# Patient Record
Sex: Female | Born: 1950 | Race: White | Hispanic: No | Marital: Married | Smoking: Never smoker
Health system: Southern US, Community
[De-identification: ages and names within clinical notes are randomized; demographics above are authoritative.]

## PROBLEM LIST (undated history)

## (undated) DIAGNOSIS — M545 Low back pain, unspecified: Secondary | ICD-10-CM

## (undated) DIAGNOSIS — G459 Transient cerebral ischemic attack, unspecified: Secondary | ICD-10-CM

## (undated) DIAGNOSIS — IMO0001 Reserved for inherently not codable concepts without codable children: Secondary | ICD-10-CM

## (undated) DIAGNOSIS — G5600 Carpal tunnel syndrome, unspecified upper limb: Secondary | ICD-10-CM

## (undated) DIAGNOSIS — M1990 Unspecified osteoarthritis, unspecified site: Secondary | ICD-10-CM

## (undated) DIAGNOSIS — R112 Nausea with vomiting, unspecified: Secondary | ICD-10-CM

## (undated) DIAGNOSIS — F329 Major depressive disorder, single episode, unspecified: Secondary | ICD-10-CM

## (undated) DIAGNOSIS — I10 Essential (primary) hypertension: Secondary | ICD-10-CM

## (undated) DIAGNOSIS — G43909 Migraine, unspecified, not intractable, without status migrainosus: Secondary | ICD-10-CM

## (undated) DIAGNOSIS — N390 Urinary tract infection, site not specified: Secondary | ICD-10-CM

## (undated) DIAGNOSIS — E119 Type 2 diabetes mellitus without complications: Secondary | ICD-10-CM

## (undated) DIAGNOSIS — F32A Depression, unspecified: Secondary | ICD-10-CM

## (undated) DIAGNOSIS — M069 Rheumatoid arthritis, unspecified: Secondary | ICD-10-CM

## (undated) DIAGNOSIS — J329 Chronic sinusitis, unspecified: Secondary | ICD-10-CM

## (undated) DIAGNOSIS — F419 Anxiety disorder, unspecified: Secondary | ICD-10-CM

## (undated) DIAGNOSIS — M810 Age-related osteoporosis without current pathological fracture: Secondary | ICD-10-CM

## (undated) DIAGNOSIS — R748 Abnormal levels of other serum enzymes: Secondary | ICD-10-CM

## (undated) DIAGNOSIS — Z9889 Other specified postprocedural states: Secondary | ICD-10-CM

## (undated) DIAGNOSIS — M797 Fibromyalgia: Secondary | ICD-10-CM

## (undated) HISTORY — PX: ROTATOR CUFF REPAIR: SHX139

## (undated) HISTORY — PX: SHOULDER SURGERY: SHX246

## (undated) HISTORY — DX: Migraine, unspecified, not intractable, without status migrainosus: G43.909

## (undated) HISTORY — PX: TUBAL LIGATION: SHX77

## (undated) HISTORY — PX: BACK SURGERY: SHX140

## (undated) HISTORY — DX: Transient cerebral ischemic attack, unspecified: G45.9

## (undated) HISTORY — DX: Essential (primary) hypertension: I10

## (undated) HISTORY — PX: NASAL SINUS SURGERY: SHX719

## (undated) HISTORY — DX: Rheumatoid arthritis, unspecified: M06.9

## (undated) HISTORY — PX: TONSILLECTOMY: SUR1361

## (undated) HISTORY — DX: Depression, unspecified: F32.A

## (undated) HISTORY — DX: Abnormal levels of other serum enzymes: R74.8

## (undated) HISTORY — PX: PARTIAL HYSTERECTOMY: SHX80

## (undated) HISTORY — DX: Reserved for inherently not codable concepts without codable children: IMO0001

## (undated) HISTORY — DX: Low back pain: M54.5

## (undated) HISTORY — DX: Carpal tunnel syndrome, unspecified upper limb: G56.00

## (undated) HISTORY — DX: Fibromyalgia: M79.7

## (undated) HISTORY — DX: Age-related osteoporosis without current pathological fracture: M81.0

## (undated) HISTORY — DX: Anxiety disorder, unspecified: F41.9

## (undated) HISTORY — DX: Low back pain, unspecified: M54.50

## (undated) HISTORY — DX: Major depressive disorder, single episode, unspecified: F32.9

## (undated) HISTORY — DX: Urinary tract infection, site not specified: N39.0

## (undated) HISTORY — DX: Type 2 diabetes mellitus without complications: E11.9

---

## 2001-04-12 ENCOUNTER — Ambulatory Visit (HOSPITAL_COMMUNITY): Admission: RE | Admit: 2001-04-12 | Discharge: 2001-04-12 | Payer: Self-pay | Admitting: Internal Medicine

## 2001-04-17 ENCOUNTER — Encounter (INDEPENDENT_AMBULATORY_CARE_PROVIDER_SITE_OTHER): Payer: Self-pay | Admitting: Internal Medicine

## 2001-04-17 ENCOUNTER — Ambulatory Visit (HOSPITAL_COMMUNITY): Admission: RE | Admit: 2001-04-17 | Discharge: 2001-04-17 | Payer: Self-pay | Admitting: Internal Medicine

## 2001-05-09 ENCOUNTER — Encounter: Payer: Self-pay | Admitting: Internal Medicine

## 2001-05-09 ENCOUNTER — Encounter: Admission: RE | Admit: 2001-05-09 | Discharge: 2001-05-09 | Payer: Self-pay | Admitting: Internal Medicine

## 2001-07-17 HISTORY — PX: COLONOSCOPY: SHX174

## 2001-08-21 ENCOUNTER — Ambulatory Visit (HOSPITAL_COMMUNITY): Admission: RE | Admit: 2001-08-21 | Discharge: 2001-08-21 | Payer: Self-pay | Admitting: Pulmonary Disease

## 2001-08-27 ENCOUNTER — Ambulatory Visit (HOSPITAL_COMMUNITY): Admission: RE | Admit: 2001-08-27 | Discharge: 2001-08-27 | Payer: Self-pay | Admitting: Internal Medicine

## 2002-03-04 ENCOUNTER — Ambulatory Visit (HOSPITAL_COMMUNITY): Admission: RE | Admit: 2002-03-04 | Discharge: 2002-03-04 | Payer: Self-pay | Admitting: Pulmonary Disease

## 2002-11-19 ENCOUNTER — Ambulatory Visit (HOSPITAL_COMMUNITY): Admission: RE | Admit: 2002-11-19 | Discharge: 2002-11-19 | Payer: Self-pay | Admitting: Pulmonary Disease

## 2003-07-15 ENCOUNTER — Ambulatory Visit (HOSPITAL_COMMUNITY): Admission: RE | Admit: 2003-07-15 | Discharge: 2003-07-16 | Payer: Self-pay | Admitting: Orthopaedic Surgery

## 2004-03-30 ENCOUNTER — Ambulatory Visit: Admission: RE | Admit: 2004-03-30 | Discharge: 2004-03-30 | Payer: Self-pay | Admitting: Pulmonary Disease

## 2005-03-01 ENCOUNTER — Encounter: Admission: RE | Admit: 2005-03-01 | Discharge: 2005-03-01 | Payer: Self-pay | Admitting: Internal Medicine

## 2005-09-01 ENCOUNTER — Emergency Department (HOSPITAL_COMMUNITY): Admission: EM | Admit: 2005-09-01 | Discharge: 2005-09-01 | Payer: Self-pay | Admitting: Emergency Medicine

## 2005-11-08 ENCOUNTER — Ambulatory Visit: Payer: Self-pay | Admitting: Cardiology

## 2005-11-13 ENCOUNTER — Ambulatory Visit: Payer: Self-pay | Admitting: Cardiology

## 2005-11-29 ENCOUNTER — Ambulatory Visit: Payer: Self-pay

## 2005-11-29 ENCOUNTER — Encounter: Payer: Self-pay | Admitting: Cardiology

## 2005-12-28 ENCOUNTER — Ambulatory Visit: Payer: Self-pay | Admitting: Cardiology

## 2006-01-03 ENCOUNTER — Ambulatory Visit: Payer: Self-pay

## 2006-01-05 ENCOUNTER — Ambulatory Visit: Payer: Self-pay | Admitting: Cardiology

## 2006-01-16 ENCOUNTER — Ambulatory Visit: Payer: Self-pay | Admitting: Internal Medicine

## 2006-01-18 ENCOUNTER — Ambulatory Visit: Payer: Self-pay | Admitting: Internal Medicine

## 2006-01-22 ENCOUNTER — Ambulatory Visit (HOSPITAL_COMMUNITY): Admission: RE | Admit: 2006-01-22 | Discharge: 2006-01-22 | Payer: Self-pay | Admitting: Internal Medicine

## 2006-01-22 ENCOUNTER — Ambulatory Visit: Payer: Self-pay | Admitting: Internal Medicine

## 2006-01-29 ENCOUNTER — Ambulatory Visit: Payer: Self-pay

## 2006-03-01 ENCOUNTER — Ambulatory Visit: Payer: Self-pay | Admitting: Internal Medicine

## 2006-04-02 ENCOUNTER — Ambulatory Visit: Payer: Self-pay | Admitting: Cardiology

## 2006-04-24 ENCOUNTER — Ambulatory Visit: Payer: Self-pay | Admitting: Internal Medicine

## 2007-06-26 ENCOUNTER — Ambulatory Visit (HOSPITAL_COMMUNITY): Admission: RE | Admit: 2007-06-26 | Discharge: 2007-06-26 | Payer: Self-pay | Admitting: *Deleted

## 2007-07-03 ENCOUNTER — Ambulatory Visit (HOSPITAL_COMMUNITY): Admission: RE | Admit: 2007-07-03 | Discharge: 2007-07-03 | Payer: Self-pay | Admitting: *Deleted

## 2009-05-09 ENCOUNTER — Emergency Department (HOSPITAL_COMMUNITY): Admission: EM | Admit: 2009-05-09 | Discharge: 2009-05-09 | Payer: Self-pay | Admitting: Emergency Medicine

## 2009-05-12 ENCOUNTER — Ambulatory Visit (HOSPITAL_COMMUNITY): Admission: RE | Admit: 2009-05-12 | Discharge: 2009-05-12 | Payer: Self-pay | Admitting: Rheumatology

## 2009-05-28 ENCOUNTER — Ambulatory Visit (HOSPITAL_COMMUNITY): Admission: RE | Admit: 2009-05-28 | Discharge: 2009-05-28 | Payer: Self-pay | Admitting: Pulmonary Disease

## 2009-11-11 ENCOUNTER — Ambulatory Visit: Payer: Self-pay | Admitting: Cardiology

## 2009-11-11 ENCOUNTER — Ambulatory Visit (HOSPITAL_COMMUNITY): Admission: RE | Admit: 2009-11-11 | Discharge: 2009-11-11 | Payer: Self-pay | Admitting: Pulmonary Disease

## 2009-12-28 ENCOUNTER — Emergency Department (HOSPITAL_COMMUNITY): Admission: EM | Admit: 2009-12-28 | Discharge: 2009-12-28 | Payer: Self-pay | Admitting: Emergency Medicine

## 2010-04-18 ENCOUNTER — Ambulatory Visit (HOSPITAL_COMMUNITY): Admission: RE | Admit: 2010-04-18 | Discharge: 2010-04-18 | Payer: Self-pay | Admitting: Pulmonary Disease

## 2010-10-03 LAB — COMPREHENSIVE METABOLIC PANEL
Alkaline Phosphatase: 85 U/L (ref 39–117)
BUN: 33 mg/dL — ABNORMAL HIGH (ref 6–23)
CO2: 24 mEq/L (ref 19–32)
Calcium: 9.5 mg/dL (ref 8.4–10.5)
Creatinine, Ser: 2.59 mg/dL — ABNORMAL HIGH (ref 0.4–1.2)
Glucose, Bld: 107 mg/dL — ABNORMAL HIGH (ref 70–99)
Potassium: 3.6 mEq/L (ref 3.5–5.1)
Sodium: 133 mEq/L — ABNORMAL LOW (ref 135–145)
Total Bilirubin: 0.5 mg/dL (ref 0.3–1.2)

## 2010-10-03 LAB — DIFFERENTIAL
Eosinophils Relative: 2 % (ref 0–5)
Neutro Abs: 6.5 10*3/uL (ref 1.7–7.7)

## 2010-10-03 LAB — URINE CULTURE: Colony Count: NO GROWTH

## 2010-10-03 LAB — URINALYSIS, ROUTINE W REFLEX MICROSCOPIC
Glucose, UA: NEGATIVE mg/dL
Hgb urine dipstick: NEGATIVE
Nitrite: NEGATIVE
Specific Gravity, Urine: 1.03 (ref 1.005–1.030)

## 2010-10-03 LAB — CBC
Platelets: 374 10*3/uL (ref 150–400)
RBC: 3.71 MIL/uL — ABNORMAL LOW (ref 3.87–5.11)

## 2010-11-29 NOTE — Op Note (Signed)
NAMEMONTASIA, CHISENHALL NO.:  [id number]   MEDICAL RECORD NO.:  [id number]          PATIENT TYPE:  OIB   LOCATION:  2899                         FACILITY:  [facility]   PHYSICIAN:  Darlin Priestly, MD  DATE OF BIRTH:  02/05/1951   DATE OF PROCEDURE:  07/03/2007  DATE OF DISCHARGE:                               OPERATIVE REPORT   PROCEDURE:  Explant of a Medtronic Reveal Plus loop recorder.   ATTENDING PHYSICIAN:  Darlin Priestly, MD   COMPLICATIONS:  None.   INDICATIONS:  Ms. Shores is a 60 year old female patient of Dr. Kem Boroughs, Dr. Shaune Pollack with a history of syncope with negative tilt  table testing a subsequent implant of a loop recorder by Dr. Berton Mount  on January 22, 2006.  This was a Medtronic loop recorder serial number  [id number].  The patient was essentially lost to follow-up through the  [facility] and was seen by Dr. Kem Boroughs in her office on  June 17, 2007 asking to have the loop recorder explanted.  She hd had  no recurrent syncope episodes since her implant.  We did subsequent  interrogate her device in the office revealing no significant  arrhythmias.  She is now brought for loop explant.   DESCRIPTION OF OPERATION:  After informed consent, the patient was  brought to the cardiac cath lab where left chest was prepped and draped  in sterile fashion.  ECG monitoring was established.  1% lidocaine was  used to anesthetize over the previous implanted device.  Next,  approximately 1.5 cm incision then carried out over the device and  hemostasis obtained with electrocautery.  Blunt dissection carried this  down to the loop recorder capsule.  The capsule was then opened with  scalpel and device was then freed from the pocket.  The silk sutures  were then removed from the pocket.  Pocket was then copiously with 1%  kanamycin solution again and hemostasis was confirmed.  Subcutaneous  layer was then closed using 2-0 Vicryl.  The skin  was then closed in 4-0  Vicryl.  Steri-Strips were applied.  The patient transferred to recovery  room in stable condition.   CONCLUSIONS:  Successful explant of a Medtronic Reveal Plus loop  recorder serial number [id number] without complication.      Darlin Priestly, MD  Electronically Signed     RHM/MEDQ  D:  07/03/2007  T:  07/03/2007  Job:  [id number]   cc:   Dani Gobble, MD  Oneal Deputy. Juanetta Gosling, M.D.

## 2010-12-02 NOTE — Procedures (Signed)
[name], Sharon Spencer               ACCOUNT NO.:  [id number]   MEDICAL RECORD NO.:  [id number]          PATIENT TYPE:  OUT   LOCATION:  SLEEP LAB                     FACILITY:  [facility]   PHYSICIAN:  Marcelyn Bruins, M.D. [facility] DATE OF BIRTH:  01-Jul-1951   DATE OF STUDY:  03/30/2004                              NOCTURNAL POLYSOMNOGRAM   REFERRING PHYSICIAN:  Dr. Kari Baars.   INDICATION FOR THE STUDY:  Hypersomnia with sleep apnea.   SLEEP ARCHITECTURE:  The patient had a total sleep time of 348 minutes with  a sleep efficiency of 81%.  There was decreased REM and slow wave sleep.  Sleep onset latency was prolonged at 51 minutes, and REM latency was quite  prolonged at 249 minutes.   IMPRESSION:  1.  Moderate obstructive sleep apnea/hypopnea syndrome with moderate oxygen      desaturation.  The patient had a respiratory disturbance index of 31      events per hour and desaturation as low as 78%.  The events were not      positional but they were clearly more severe during REM.  The patient      did not meet split night criteria due to most of her events occurring in      the latter half of the night.  2.  Loud snoring noted throughout the study.  3.  No clinically significant cardiac arrhythmias.  4.  Large numbers of periodic leg movements with mild to moderate sleep      disruption.  Clinical correlation is suggested if she fails to improve      with treatment of her sleep apnea.      KC/MEDQ  D:  05/06/2004 16:18:11  T:  05/06/2004 17:36:35  Job:  [id number]
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
## 2010-12-02 NOTE — Op Note (Signed)
Sharon Spencer, KEHRES NO.:  [id number]   MEDICAL RECORD NO.:  [id number]          PATIENT TYPE:  OIB   LOCATION:  2899                         FACILITY:  [facility]   PHYSICIAN:  Duke Salvia, M.D.  DATE OF BIRTH:  08/08/1950   DATE OF PROCEDURE:  01/22/2006  DATE OF DISCHARGE:                                 OPERATIVE REPORT   PREOPERATIVE DIAGNOSIS:  Recurrent syncope.   POSTOPERATIVE DIAGNOSIS:  Recurrent syncope.   PROCEDURE:  Tilt table testing.   DESCRIPTION OF PROCEDURE:  Following obtaining informed consent, the patient  was brought to the electrophysiology laboratory and placed on the  fluoroscopic table in the supine position.  After routine prep and drape,  the patient was equilibrated in the supine position for about 10 minutes.  She was then tilted upright at 70 degrees.  From a blood pressure mean of  about 165 or so/80, with a pulse of 62, the blood pressure drifted slightly  over the ensuing 30 minutes down to a nadir of 134 with subsequent  equilibration at 155/91, so the final measurements were 155/91 with a pulse  of 67.  At this point, she was returned to the supine position.   Isoproterenol was started with 1 mg per minute accomplished and a 25%  increase in the incremental heart rate.  At that point, she was tilted up,  with a blood pressure of 151/85 with a pulse of 88.  The pulse stayed  stable.  There was a modest decrease in the blood pressure to a nadir of 127  but a mean of about 135.   IMPRESSION:  Negative tilt table test.   RECOMMENDATIONS:  Loop recorder implantation.           ______________________________  Duke Salvia, M.D.     SCK/MEDQ  D:  01/22/2006  T:  01/22/2006  Job:  [id number]   cc:   Olga Millers, M.D. [facility]  [address]  [address]  [place]  [place] [address]   Oneal Deputy. Juanetta Gosling, M.D.  Fax: [phone]
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
## 2010-12-02 NOTE — Assessment & Plan Note (Signed)
[facility]                           ELECTROPHYSIOLOGY OFFICE NOTE   Sharon Spencer, Sharon Spencer                      MRN:          [id number]  DATE:02/21/2006                            DOB:          1950/10/21    Sharon Spencer returns today for a wound check.  She is a very pleasant middle-  aged woman with a history of unexplained syncope.  She underwent insertion  of implantable loop recorder several weeks ago.  She developed tenderness,  soreness and drainage at the insertion site, which she states has improved  in the last week.  She denies fevers, chills or night sweats.   PHYSICAL EXAMINATION:  She is a pleasant middle-aged woman in no distress.  The blood pressure today was 130/78, the pulse 60 and regular.  Respirations  are 18.  Weight was 150 pounds.  Neck revealed no jugular venous distention.  Lungs are clear bilaterally with auscultation.  Cardiovascular exam revealed  a regular rate and rhythm with normal S1, S2.  Extremities demonstrated no  edema.  Her loop recorder insertion site had a 1 cm area of nonhealing of  the incision line with no obvious drainage at the present time, all  consistent with a stitch abscess.   IMPRESSION:  1.  Stitch abscess.  2.  Syncope.  3.  Status post loop recorder.   DISCUSSION:  I have recommended that Sharon Spencer take some antibiotics and  wash the area once or twice a day.  In addition, we have probed the area  today to see if there is any pockets of purulence, but there is not.  We  will plan to see her back in 1-2 weeks.                                   Doylene Canning. Ladona Ridgel, MD   GWT/MedQ  DD:  02/21/2006  DT:  02/21/2006  Job #:  [id number]

## 2010-12-02 NOTE — Op Note (Signed)
NAMEVERDELLE, VALTIERRA NO.:  [id number]   MEDICAL RECORD NO.:  [id number]          PATIENT TYPE:  OIB   LOCATION:  2899                         FACILITY:  [facility]   PHYSICIAN:  Duke Salvia, M.D.  DATE OF BIRTH:  06/03/1951   DATE OF PROCEDURE:  01/22/2006  DATE OF DISCHARGE:                                 OPERATIVE REPORT   PREOPERATIVE DIAGNOSIS:  Syncope.   POSTOPERATIVE DIAGNOSIS:  Syncope.   PROCEDURE:  Implantable loop insertion.   DESCRIPTION OF PROCEDURE:  After obtaining informed consent, the patient was  then prepared for loop recorder insertion.  After transcutaneous mapping,  the patient was prepped and draped in the standard fashion.  Lidocaine was  infiltrated about 5 cm caudal to the clavicle and about 2.5 cm lateral to  the sternum.   The incision was about 1.5 cm long and was carried down to the prepectoral  fascia.  A pocket was formed.  Two 0 silk sutures were placed at the  cephalad portion of the pocket which were used to then affix a Medtronic  loop recorder.  Loop recorder serial number was [id number].  The device was  inserted and secured to the prepectoral fascia.  The pocket was copiously  irrigated with antibiotic-containing saline solution.  Hemostasis having  been assured, the wound was then closed in three layers in normal fashion.  It was washed and dried, and a benzoin and Steri-Strip dressing was applied.  Needle counts, sponge counts, and instrument counts were correct at the end  of the procedure according to the staff.   The patient tolerated the procedure without apparent complications.           ______________________________  Duke Salvia, M.D.     SCK/MEDQ  D:  01/22/2006  T:  01/22/2006  Job:  [id number]   cc:   Olga Millers, M.D. [facility]  [address]  [address]  [place]  [place] [address]   Oneal Deputy. Juanetta Gosling, M.D.  Fax: [phone]

## 2010-12-02 NOTE — Assessment & Plan Note (Signed)
[facility]                           ELECTROPHYSIOLOGY OFFICE NOTE   [name], [name]                      MRN:          [id number]  DATE:01/29/2006                            DOB:          [birth year]    Sharon Spencer is seen in the device clinic today, January 29, 2006 for postop  followup of her recently implanted loop recorder.  Procedure done July 9 by  Dr. Graciela Husbands.   Upon exam, occlusive dressing has not been removed from the site.  She was  instructed to leave everything in place until seen in the office.  At this  point, her dressing and Steri-Strips were removed.  Site looks good without  redness or swelling.  Upon interrogation, there were no episodes stored in  the device.   The patient is aware of how to use the recorder and is aware to call us if  she does have a full syncopal episode.  Otherwise, she will be seen in  October by Dr. Graciela Husbands.                                   Cleatrice Burke, RN                                Duke Salvia, MD, [facility]   CF/MedQ  DD:  01/29/2006  DT:  01/29/2006  Job #:  [id number]

## 2010-12-02 NOTE — Op Note (Signed)
NAME:  Sharon Spencer, Sharon Spencer                         ACCOUNT NO.:  [id number]   MEDICAL RECORD NO.:  [id number]                   PATIENT TYPE:  OIB   LOCATION:  5003                                 FACILITY:  [facility]   PHYSICIAN:  Mark C. Ophelia Charter, M.D.                 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   OPERATION PERFORMED:  L4-5 decompression with bilateral foraminotomies.  Left microdiskectomy.   SURGEON:  Mark C. Ophelia Charter, M.D.   ASSISTANT:  Genene Churn. Denton Meek.   ANESTHESIA:  General orotracheal anesthesia plus Marcaine skin local.   DESCRIPTION OF PROCEDURE:  After induction of general anesthesia and  orotracheal intubation, the patient was placed on the Andrews frame with  careful padding and positioning.  The back was prepped with DuraPrep, the  area was squared with towels.  Betadine Vi-drape applied and the usual  laminectomy sheet.  Incision was made.  Cross-table lateral x-ray was used  to confirm the appropriate level with a needle placed at the 4-5 level.  Subperiosteal dissection of the lamina was performed.  Self-retaining  retractor was placed with the medium depth narrow blades.  The lamina was  removed about 50% on both the right and left side of the inferior one half  of the spinous process and the top one third of the L5 spinous process and  of course the lamina of both the right and left side.  There were some thick  chunks of ligament.  There was some epidural fat in the midline but no  epidural fat off the midline with some compression at the level of the  pedicle and just distal as the nerve roots entered the foramina bilaterally.  Thick chunks of ligament were removed and the spurs off the facet that were  narrowing.  Once this was performed and bone was removed on  both sides of  the pedicle, proximal areas were carefully palpated and there was room where  she did not require complete laminectomy.  Foramina were enlarged  bilaterally.  The vein was coagulated on the left side.  Disk was very soft  and bulging.  An incision was made.  Passes were made with just the minimal  amount of material.  It did not appear that the disk by MRI or by exam was  causing a significant amount of compression but it certainly was very soft  and there was some degenerative material that was centrally found that may  have been causing some intermittent bulging for the patient.  After  irrigation, the operative that had been used soon as the initial bone and  lamina were removal was started was then removed from the operative field.  Dura was intact.  There  was a minimal amount of bleeding and the fascia was  approximated with three or four simple interrupted Vicryl sutures, 2-0  Vicryl in the subcutaneous tissue and skin staple closure, 4 x 4s, ABD and  tape dressing.  The patient tolerated the procedure well and was transferred  to the recovery room in stable condition.                                               Mark C. Ophelia Charter, M.D.    MCY/MEDQ  D:  07/15/2003  T:  07/16/2003  Job:  [id number]

## 2010-12-02 NOTE — Letter (Signed)
March 01, 2006     Ramon Dredge L. Juanetta Gosling, MD  [address]  [place], [place] [address]   RE:  KARLYN, GLASCO  MRN:  [id number]  /  DOB:  [birth year]   Dear Renae Fickle,   Lonzo Candy is seen today again following loop recorder implantation.  Her  wound is healing.  There were some concerns about that initially.  Her  evaluation has included essentially normal cardiac function.  She had  negative tilt and underwent loop recorder implantation.   She is doing okay without recurrent syncope since May.   At this point, I will be reviewing the literature, but I think at three  months following syncope, driving is okay to be resumed.    Sincerely,      Duke Salvia, MD, [facility]   SCK/MedQ  DD:  03/01/2006  DT:  03/01/2006  Job #:  [id number]

## 2010-12-02 NOTE — Discharge Summary (Signed)
   NAME:  Sharon Spencer, VACCARELLA                         ACCOUNT NO.:  [id number]   MEDICAL RECORD NO.:  [id number]                   PATIENT TYPE:  OUT   LOCATION:  RAD                                  FACILITY:  [facility]   PHYSICIAN:  Vida Roller, M.D.                DATE OF BIRTH:  05-10-1951   DATE OF ADMISSION:  11/19/2002  DATE OF DISCHARGE:  11/19/2002                                 DISCHARGE SUMMARY   HISTORY OF PRESENT ILLNESS:  This is a 60 year old female with black out  spells.   DETAILS OF THE PROCEDURE:  The patient underwent ambulatory  electrocardiographic monitoring over a 23 hour 59 minute period from May 6-  7, 2004.  At that time she kept a detailed diary of her symptoms and  activities.  The technical quality of the study was adequate although there  was significant  artifact in several places.  The diary shows extensive  amount of activity with no documented symptoms.  Results, the predominant  rhythm is sinus throughout.  There are occasional PVCs.  There were no PACs.  There was no atrial arrhythmia.  There is no ventricular arrhythmia.  Resting heart rate is 57 and maximum heart rate is 119 all in sinus.  There  is no evidence of ventricular tachycardia, atrial tachycardia, atrial  fibrillation, ventricular fibrillation or significant  pause.  In essence a  benign Holter which is asymptomatic.                                                Vida Roller, M.D.    JH/MEDQ  D:  11/21/2002  T:  11/22/2002  Job:  [id number]

## 2010-12-02 NOTE — Assessment & Plan Note (Signed)
[facility]                              CARDIOLOGY OFFICE NOTE   ADWOA, AXE                      MRN:          [id number]  DATE:04/02/2006                            DOB:          May 04, 1951    Sharon Spencer returns for follow-up today.  She is a female whom I initially  saw in April secondary to hypertension.  At that time she related that she  had had multiple episodes of syncope in the past.  Workup included an  echocardiogram that showed normal LV function and mild left atrial  enlargement.  She had a nuclear study that apparently was unremarkable with  an ejection fraction of 77% and normal perfusion.  She also had an event  monitor.  She apparently has had 2 syncopal episodes, but did not activate  the monitor, per my notes of January 05, 2006.  It apparently malfunctioned 1  time, and the second time she had taken it off for bed.  She continued to  complain of syncopal episodes in June.  At that time we referred her to see  Dr. Graciela Husbands for possible loop implantation.  Dr. Graciela Husbands felt that the syncope  was most likely neurally mediated.  He therefore scheduled her to have a  tilt table.  This was negative and he subsequently put in a loop recorder.  The loop recorder was implanted on January 22, 2006.  I do have a note from Dr.  Graciela Husbands dated March 05, 2006 that states that [name] can resume driving  the first week in October.  Since that time she has had one dizzy spell, but  no further syncope by her report.  She denies any dyspnea or chest pain.  She is extremely upset in the office today, stating she is having  difficulties with her disability.  I explained to her that we could not  allow her to drive due to the risk of her having syncope while driving with  injury to herself or others.   MEDICATIONS AT PRESENT:  1. Atenolol 50 mg p.o. daily.  2. Cymbalta.  3. Folate.  4. Plaquenil 200 mg p.o. b.i.d.  5. Topamax 150 mg p.o.  nightly.  6. Verapamil SR 180 mg p.o. q. day.  7. Zanaflex.  8. Aspirin.  9. Altace 10 mg p.o. b.i.d.   PHYSICAL EXAM:  VITAL SIGNS:  Blood pressure 146/78, pulse 63.  She weighs  143 pounds.  CHEST:  Clear.  CARDIOVASCULAR:  Regular rate and rhythm.  EXTREMITIES:  No edema.   Her electrocardiogram today shows normal sinus rhythm at a rate of 63.  There are no ST changes noted.  Her QT is normal.  Her loop was interrogated  today and apparently showed no significant rhythm disturbance.   DIAGNOSES:  1. Recurrent syncope, cause unknown.  2. Hypertension.  3. History of mild hyperlipidemia.  4. Fibromyalgia.  5. Rheumatoid arthritis.  6. History of ulcerative colitis.  7. Migraine headaches.  8. Remote history of cerebrovascular accident by her report.   PLAN:  [name] has had no further syncopal  episodes.  Per Dr. Odessa Fleming  notes, she will be able to drive the first week in October.  She will  continue with the implantable loop and see him back as scheduled.  We have  scheduled her back in 6 months.  Note:  Her blood pressure appears to be  reasonably well controlled.                              Madolyn Frieze Jens Som, MD, [facility]    BSC/MedQ  DD:  04/02/2006  DT:  04/03/2006  Job #:  [id number]   cc:   Ramon Dredge L. Juanetta Gosling, M.D.

## 2011-12-07 ENCOUNTER — Other Ambulatory Visit (HOSPITAL_COMMUNITY): Payer: Self-pay | Admitting: Pulmonary Disease

## 2011-12-07 DIAGNOSIS — G459 Transient cerebral ischemic attack, unspecified: Secondary | ICD-10-CM

## 2011-12-12 ENCOUNTER — Other Ambulatory Visit (HOSPITAL_COMMUNITY): Payer: Self-pay

## 2011-12-14 ENCOUNTER — Ambulatory Visit (HOSPITAL_COMMUNITY)
Admission: RE | Admit: 2011-12-14 | Discharge: 2011-12-14 | Disposition: A | Discharge status: Home / Self Care | Payer: Medicare Other | Source: Ambulatory Visit | Attending: Pulmonary Disease | Admitting: Pulmonary Disease

## 2011-12-14 DIAGNOSIS — G459 Transient cerebral ischemic attack, unspecified: Secondary | ICD-10-CM

## 2011-12-14 DIAGNOSIS — I10 Essential (primary) hypertension: Secondary | ICD-10-CM | POA: Insufficient documentation

## 2011-12-14 DIAGNOSIS — I429 Cardiomyopathy, unspecified: Secondary | ICD-10-CM | POA: Insufficient documentation

## 2011-12-14 DIAGNOSIS — J329 Chronic sinusitis, unspecified: Secondary | ICD-10-CM | POA: Insufficient documentation

## 2012-03-28 LAB — CBC: MCV: 89 fL (ref 78–100)

## 2012-03-28 LAB — BASIC METABOLIC PANEL
BUN: 18 mg/dL (ref 4–21)
Calcium: 9.9 mg/dL
Creat: 0.9
Glucose: 133
Sodium: 136 mmol/L — AB (ref 137–147)

## 2012-04-09 ENCOUNTER — Other Ambulatory Visit (HOSPITAL_COMMUNITY): Payer: Self-pay | Admitting: Pulmonary Disease

## 2012-04-09 DIAGNOSIS — M797 Fibromyalgia: Secondary | ICD-10-CM

## 2012-04-09 DIAGNOSIS — M069 Rheumatoid arthritis, unspecified: Secondary | ICD-10-CM

## 2012-04-15 ENCOUNTER — Ambulatory Visit (HOSPITAL_COMMUNITY)
Admission: RE | Admit: 2012-04-15 | Discharge: 2012-04-15 | Disposition: A | Discharge status: Home / Self Care | Payer: Medicare Other | Source: Ambulatory Visit | Attending: Pulmonary Disease | Admitting: Pulmonary Disease

## 2012-04-15 DIAGNOSIS — M810 Age-related osteoporosis without current pathological fracture: Secondary | ICD-10-CM | POA: Insufficient documentation

## 2012-04-15 DIAGNOSIS — M797 Fibromyalgia: Secondary | ICD-10-CM

## 2012-04-15 DIAGNOSIS — M069 Rheumatoid arthritis, unspecified: Secondary | ICD-10-CM

## 2012-05-22 ENCOUNTER — Ambulatory Visit (INDEPENDENT_AMBULATORY_CARE_PROVIDER_SITE_OTHER): Payer: Medicare Other | Admitting: Gastroenterology

## 2012-05-22 ENCOUNTER — Encounter: Payer: Self-pay | Admitting: Gastroenterology

## 2012-05-22 VITALS — BP 135/85 | HR 71 | Temp 98.1°F | Ht 61.0 in | Wt 170.2 lb

## 2012-05-22 DIAGNOSIS — R109 Unspecified abdominal pain: Secondary | ICD-10-CM | POA: Insufficient documentation

## 2012-05-22 DIAGNOSIS — K219 Gastro-esophageal reflux disease without esophagitis: Secondary | ICD-10-CM

## 2012-05-22 DIAGNOSIS — R194 Change in bowel habit: Secondary | ICD-10-CM | POA: Insufficient documentation

## 2012-05-22 DIAGNOSIS — R1310 Dysphagia, unspecified: Secondary | ICD-10-CM | POA: Insufficient documentation

## 2012-05-22 DIAGNOSIS — R198 Other specified symptoms and signs involving the digestive system and abdomen: Secondary | ICD-10-CM

## 2012-05-22 NOTE — Progress Notes (Signed)
Primary Care Physician:  Fredirick Maudlin, MD  Primary Gastroenterologist:  Jonette Eva, MD   Chief Complaint  Patient presents with  . Dysphagia    HPI:  Sharon Spencer is a 61 y.o. female here for further evaluation of esophageal dysphagia at the request of Dr. Juanetta Gosling. She states she's been having refractory reflux. Lots of regurgitation at night. When it occurs, it lasts for several hours. Really bad over the past 6 months. She's been on protonix for about 4 months. Previously had tried to come off of it but couldn't tolerate it. Last month she had her Norvasc pill gets stuck. This caused severe odynophagia for several days. She notes if she eats too fast her food becomes lodged as well. It can happen with liquids. She tries to chop her meat up finely but sometimes if he becomes lodged and she has to induce vomiting for relief. Has had intermittent NSAID use but none recently. Typically causes her abdominal pain. She believes Plaquenil causes abdominal cramping.  BM alternate from diarrhea and constipation. Stool softner prn, couple of times per week. If skips couple of days, then leads to vomiting and diarrhea. No melena, brbpr. Occasional TUMS/Rolaids. Cannot tolerate vit D due to stomach spasms.   Yeast infections on Humira.  Just started Fosamax one month ago. Chronic prednisone 5 mg daily.  Current Outpatient Prescriptions  Medication Sig Dispense Refill  . adalimumab (HUMIRA) 40 MG/0.8ML injection Inject 40 mg into the skin every 14 (fourteen) days.      Marland Kitchen alendronate (FOSAMAX) 70 MG tablet Take 70 mg by mouth every 7 (seven) days.       . ALPRAZolam (XANAX) 0.5 MG tablet Take 0.5 mg by mouth at bedtime as needed.       Marland Kitchen amLODipine (NORVASC) 10 MG tablet Take 10 mg by mouth daily.       . clobetasol cream (TEMOVATE) 0.05 % Apply 1 application topically 2 (two) times daily.       . CYMBALTA 60 MG capsule Take 60 mg by mouth daily.       Marland Kitchen HYDROcodone-acetaminophen (LORTAB) 10-500  MG per tablet Take 1 tablet by mouth every 6 (six) hours as needed.      . hydroxychloroquine (PLAQUENIL) 200 MG tablet Take 200 mg by mouth daily.       . pantoprazole (PROTONIX) 40 MG tablet Take 40 mg by mouth daily.       . predniSONE (STERAPRED UNI-PAK) 5 MG TABS Take 5 mg by mouth daily.      Marland Kitchen tiZANidine (ZANAFLEX) 4 MG tablet         Allergies as of 05/22/2012 - Review Complete 05/22/2012  Allergen Reaction Noted  . Compazine (prochlorperazine edisylate)  05/22/2012  . Sulfa antibiotics  05/22/2012    Past Medical History  Diagnosis Date  . TIA (transient ischemic attack) 1991, 2006  . Carpal tunnel syndrome   . Lumbago   . Myalgia and myositis, unspecified   . Rheumatoid aortitis     sepcialist at [facility], Dr. Sigurd Sos  . Anxiety   . Depression   . Osteoporosis   . HTN (hypertension)   . Recurrent UTI     specialist at [facility]    Past Surgical History  Procedure Date  . Back surgery     lower  . Shoulder surgery     left  . Rotator cuff repair   . Tonsillectomy   . Nasal sinus surgery     x3  .  Tubal ligation   . Partial hysterectomy   . Colonoscopy     Dr. Karilyn Cota over ten years ago, ?colitis per patient? records have been requested 05/2012.     Family History  Problem Relation Age of Onset  . Colon cancer Neg Hx   . Diabetes Father   . Kidney failure Father   . Breast cancer Mother   . Stroke Mother   . Diabetes      siblings  . Lymphoma Sister   . Liver disease      history of elevated LFTs in sister too    History   Social History  . Marital Status: Married    Spouse Name: N/A    Number of Children: 1  . Years of Education: N/A   Occupational History  . disability    Social History Main Topics  . Smoking status: Never Smoker   . Smokeless tobacco: Not on file  . Alcohol Use: No  . Drug Use: No  . Sexually Active: Not on file   Other Topics Concern  . Not on file   Social History Narrative  . No narrative on file        ROS:  General: Negative for anorexia, weight loss, fever, chills, fatigue, weakness. Eyes: Negative for vision changes.  ENT: Negative for hoarseness, nasal congestion. CV: Negative for chest pain, angina, palpitations, dyspnea on exertion, peripheral edema.  Respiratory: Negative for dyspnea at rest, dyspnea on exertion, cough, sputum, wheezing.  GI: See history of present illness. GU:  Negative for dysuria, hematuria, urinary incontinence, urinary frequency, nocturnal urination.  MS: Chronic for joint pain, low back pain.  Derm: Negative for rash or itching.  Neuro: Negative for weakness, abnormal sensation, seizure, frequent headaches, memory loss, confusion.  Psych: Negative for anxiety, depression, suicidal ideation, hallucinations.  Endo: Negative for unusual weight change.  Heme: Negative for bruising or bleeding. Allergy: Negative for rash or hives.    Physical Examination:  BP 135/85  Pulse 71  Temp 98.1 F (36.7 C) (Temporal)  Ht 5\' 1"  (1.549 m)  Wt 170 lb 3.2 oz (77.202 kg)  BMI 32.16 kg/m2   General: Well-nourished, well-developed in no acute distress.  Head: Normocephalic, atraumatic.   Eyes: Conjunctiva pink, no icterus. Mouth: Oropharyngeal mucosa moist and pink , no lesions erythema or exudate. Neck: Supple without thyromegaly, masses, or lymphadenopathy.  Lungs: Clear to auscultation bilaterally.  Heart: Regular rate and rhythm, no murmurs rubs or gallops.  Abdomen: Bowel sounds are normal, nontender, nondistended, no hepatosplenomegaly or masses, no abdominal bruits or    hernia , no rebound or guarding.   Rectal: Deferred Extremities: No lower extremity edema. No clubbing or deformities.  Neuro: Alert and oriented x 4 , grossly normal neurologically.  Skin: Warm and dry, no rash or jaundice.   Psych: Alert and cooperative, normal mood and affect.

## 2012-05-22 NOTE — Patient Instructions (Addendum)
We have scheduled you for an upper endoscopy with Dr. Darrick Penna. See separate instructions. We will obtain copy of your lab work for review. Please be careful chewing food thoroughly and taking your time. Please consume plenty of liquid with your meals and medications to make sure your food/pills do not get stuck.

## 2012-05-24 ENCOUNTER — Encounter: Payer: Self-pay | Admitting: Gastroenterology

## 2012-05-24 NOTE — Assessment & Plan Note (Signed)
Dysphagia to solids/pills/liquids. Recent episode of odynophagia after having her Norvasc stick in her esophagus. Also with GERD refractory to pantoprazole. Recommend EGD/ED in near future with deep sedation due to polypharmacy. Patient requesting antiemetics at time of procedures due to prior difficulties with vomiting post-sedation.  I have discussed the risks, alternatives, benefits with regards to but not limited to the risk of reaction to medication, bleeding, infection, perforation and the patient is agreeable to proceed. Written consent to be obtained.

## 2012-05-24 NOTE — Progress Notes (Signed)
Received copy of last colonoscopy. It was done in 08/27/2001 by Dr. Karilyn Cota. Indication at that time was bloody diarrhea and abdominal cramps. 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 Biopsy suggestive of the same but not diagnostic.  Patient is due for a colonoscopy at this time but at OV I don't think she was interested.   Please let pt know that we reviewed her last colonoscopy and it has been ten years. She is due for colonoscopy now. If she is not interested, just document this. Thanks.

## 2012-05-24 NOTE — Assessment & Plan Note (Addendum)
Intermittent abdominal pain she relates to certain medications. Chronic alternating constipation/diarrhea. Retrieve copy of last colonoscopy report and last labs (Dr. Dwana Melena with [facility]). Patient not interested in colonoscopy at this time, stating she doesn't think she can tolerate both procedures right now.

## 2012-05-27 ENCOUNTER — Other Ambulatory Visit: Payer: Self-pay | Admitting: Gastroenterology

## 2012-05-27 ENCOUNTER — Encounter (HOSPITAL_COMMUNITY): Payer: Self-pay | Admitting: Pharmacy Technician

## 2012-05-27 ENCOUNTER — Telehealth: Payer: Self-pay | Admitting: Gastroenterology

## 2012-05-27 MED ORDER — PEG 3350-KCL-NA BICARB-NACL 420 G PO SOLR: 4000.0000 mL | ORAL | Status: DC

## 2012-05-27 NOTE — Progress Notes (Signed)
Faxed to PCP

## 2012-05-27 NOTE — Progress Notes (Signed)
LMOM to call.

## 2012-05-27 NOTE — Progress Notes (Signed)
Pt returned call and was informed. She said OK to add the colonoscopy to the EGD. Routing to [employer].

## 2012-05-27 NOTE — Progress Notes (Signed)
TCS has been added on an I have instructions ready for her to pick and she is aware

## 2012-05-27 NOTE — Telephone Encounter (Signed)
No PAC is required for Outpatient EGD per [facility]

## 2012-05-29 ENCOUNTER — Encounter (HOSPITAL_COMMUNITY): Payer: Self-pay

## 2012-05-29 ENCOUNTER — Encounter (HOSPITAL_COMMUNITY)
Admission: RE | Admit: 2012-05-29 | Discharge: 2012-05-29 | Disposition: A | Discharge status: Home / Self Care | Payer: Medicare Other | Source: Ambulatory Visit | Attending: Gastroenterology | Admitting: Gastroenterology

## 2012-05-29 ENCOUNTER — Inpatient Hospital Stay (HOSPITAL_COMMUNITY): Admission: RE | Admit: 2012-05-29 | Payer: Medicare Other | Source: Ambulatory Visit

## 2012-05-29 HISTORY — DX: Other specified postprocedural states: R11.2

## 2012-05-29 HISTORY — DX: Unspecified osteoarthritis, unspecified site: M19.90

## 2012-05-29 HISTORY — DX: Nausea with vomiting, unspecified: Z98.890

## 2012-05-29 LAB — BASIC METABOLIC PANEL
BUN: 16 mg/dL (ref 6–23)
CO2: 28 mEq/L (ref 19–32)
Calcium: 10.2 mg/dL (ref 8.4–10.5)
Chloride: 100 mEq/L (ref 96–112)
Creatinine, Ser: 0.86 mg/dL (ref 0.50–1.10)
Glucose, Bld: 102 mg/dL — ABNORMAL HIGH (ref 70–99)

## 2012-05-29 LAB — HEMOGLOBIN AND HEMATOCRIT, BLOOD: HCT: 46.1 % — ABNORMAL HIGH (ref 36.0–46.0)

## 2012-05-29 NOTE — Patient Instructions (Addendum)
Your procedure is scheduled on: 06/04/12   Report to [facility] at    [phone]   AM.  Call this number if you have problems the morning of surgery: [phone]   Remember:   Do not drink or eat food:After Midnight.    Take these medicines the morning of surgery with A SIP OF WATER: xanax, norvasc, tenormin, cymbalta, protonix  Do not wear jewelry, make-up or nail polish.  Do not wear lotions, powders, or perfumes. You may wear deodorant.  Do not shave 48 hours prior to surgery. Men may shave face and neck.  Do not bring valuables to the hospital.  Contacts, dentures or bridgework may not be worn into surgery.  Leave suitcase in the car. After surgery it may be brought to your room.  For patients admitted to the hospital, checkout time is 11:00 AM the day of discharge.   Patients discharged the day of surgery will not be allowed to drive home   Please read over the following fact sheets that you were given: Pain Booklet,  Surgical Site Infection Prevention, Anesthesia Post-op Instructions and Care and Recovery After Surgery  Esophagogastroduodenoscopy This is an endoscopic procedure (a procedure that uses a device like a flexible telescope) that allows your caregiver to view the upper stomach and small bowel. This test allows your caregiver to look at the esophagus. The esophagus carries food from your mouth to your stomach. They can also look at your duodenum. This is the first part of the small intestine that attaches to the stomach. This test is used to detect problems in the bowel such as ulcers and inflammation. PREPARATION FOR TEST Nothing to eat after midnight the day before the test. NORMAL FINDINGS Normal esophagus, stomach, and duodenum. Ranges for normal findings may vary among different laboratories and hospitals. You should always check with your doctor after having lab work or other tests done to discuss the meaning of your test results and whether your values are considered within  normal limits. MEANING OF TEST  Your caregiver will go over the test results with you and discuss the importance and meaning of your results, as well as treatment options and the need for additional tests if necessary. OBTAINING THE TEST RESULTS It is your responsibility to obtain your test results. Ask the lab or department performing the test when and how you will get your results. Document Released: 11/03/2004 Document Revised: 09/25/2011 Document Reviewed: 06/12/2008 [facility] Patient Information 2013 [place], [place]. Colonoscopy A colonoscopy is an exam to evaluate your entire colon. In this exam, your colon is cleansed. A long fiberoptic tube is inserted through your rectum and into your colon. The fiberoptic scope (endoscope) is a long bundle of enclosed and very flexible fibers. These fibers transmit light to the area examined and send images from that area to your caregiver. Discomfort is usually minimal. You may be given a drug to help you sleep (sedative) during or prior to the procedure. This exam helps to detect lumps (tumors), polyps, inflammation, and areas of bleeding. Your caregiver may also take a small piece of tissue (biopsy) that will be examined under a microscope. LET YOUR CAREGIVER KNOW ABOUT:   Allergies to food or medicine.  Medicines taken, including vitamins, herbs, eyedrops, over-the-counter medicines, and creams.  Use of steroids (by mouth or creams).  Previous problems with anesthetics or numbing medicines.  History of bleeding problems or blood clots.  Previous surgery.  Other health problems, including diabetes and kidney problems.  Possibility  of pregnancy, if this applies. BEFORE THE PROCEDURE   A clear liquid diet may be required for 2 days before the exam.  Ask your caregiver about changing or stopping your regular medications.  Liquid injections (enemas) or laxatives may be required.  A large amount of electrolyte solution may be given to you to  drink over a short period of time. This solution is used to clean out your colon.  You should be present 60 minutes prior to your procedure or as directed by your caregiver. AFTER THE PROCEDURE   If you received a sedative or pain relieving medication, you will need to arrange for someone to drive you home.  Occasionally, there is a little blood passed with the first bowel movement. Do not be concerned. FINDING OUT THE RESULTS OF YOUR TEST Not all test results are available during your visit. If your test results are not back during the visit, make an appointment with your caregiver to find out the results. Do not assume everything is normal if you have not heard from your caregiver or the medical facility. It is important for you to follow up on all of your test results. HOME CARE INSTRUCTIONS   It is not unusual to pass moderate amounts of gas and experience mild abdominal cramping following the procedure. This is due to air being used to inflate your colon during the exam. Walking or a warm pack on your belly (abdomen) may help.  You may resume all normal meals and activities after sedatives and medicines have worn off.  Only take over-the-counter or prescription medicines for pain, discomfort, or fever as directed by your caregiver. Do not use aspirin or blood thinners if a biopsy was taken. Consult your caregiver for medicine usage if biopsies were taken. SEEK IMMEDIATE MEDICAL CARE IF:   You have a fever.  You pass large blood clots or fill a toilet with blood following the procedure. This may also occur 10 to 14 days following the procedure. This is more likely if a biopsy was taken.  You develop abdominal pain that keeps getting worse and cannot be relieved with medicine. Document Released: 06/30/2000 Document Revised: 09/25/2011 Document Reviewed: 02/13/2008 [facility] Patient Information 2013 [place], [place].  PATIENT INSTRUCTIONS POST-ANESTHESIA  IMMEDIATELY FOLLOWING SURGERY:   Do not drive or operate machinery for the first twenty four hours after surgery.  Do not make any important decisions for twenty four hours after surgery or while taking narcotic pain medications or sedatives.  If you develop intractable nausea and vomiting or a severe headache please notify your doctor immediately.  FOLLOW-UP:  Please make an appointment with your surgeon as instructed. You do not need to follow up with anesthesia unless specifically instructed to do so.  WOUND CARE INSTRUCTIONS (if applicable):  Keep a dry clean dressing on the anesthesia/puncture wound site if there is drainage.  Once the wound has quit draining you may leave it open to air.  Generally you should leave the bandage intact for twenty four hours unless there is drainage.  If the epidural site drains for more than 36-48 hours please call the anesthesia department.  QUESTIONS?:  Please feel free to call your physician or the hospital operator if you have any questions, and they will be happy to assist you.

## 2012-06-04 ENCOUNTER — Encounter (HOSPITAL_COMMUNITY): Payer: Self-pay | Admitting: Anesthesiology

## 2012-06-04 ENCOUNTER — Other Ambulatory Visit: Payer: Self-pay | Admitting: Gastroenterology

## 2012-06-04 ENCOUNTER — Ambulatory Visit: Admit: 2012-06-04 | Payer: Self-pay | Admitting: Gastroenterology

## 2012-06-04 ENCOUNTER — Encounter (HOSPITAL_COMMUNITY)
Admission: RE | Disposition: A | Discharge status: Home / Self Care | Payer: Self-pay | Source: Ambulatory Visit | Attending: Gastroenterology

## 2012-06-04 ENCOUNTER — Ambulatory Visit (HOSPITAL_COMMUNITY)
Admission: RE | Admit: 2012-06-04 | Discharge: 2012-06-04 | Disposition: A | Discharge status: Home / Self Care | Payer: Medicare Other | Source: Ambulatory Visit | Attending: Gastroenterology | Admitting: Gastroenterology

## 2012-06-04 ENCOUNTER — Ambulatory Visit (HOSPITAL_COMMUNITY): Payer: Medicare Other | Admitting: Anesthesiology

## 2012-06-04 ENCOUNTER — Telehealth: Payer: Self-pay | Admitting: Gastroenterology

## 2012-06-04 ENCOUNTER — Encounter (HOSPITAL_COMMUNITY): Payer: Self-pay | Admitting: *Deleted

## 2012-06-04 DIAGNOSIS — K2970 Gastritis, unspecified, without bleeding: Secondary | ICD-10-CM | POA: Insufficient documentation

## 2012-06-04 DIAGNOSIS — D126 Benign neoplasm of colon, unspecified: Secondary | ICD-10-CM

## 2012-06-04 DIAGNOSIS — R1013 Epigastric pain: Secondary | ICD-10-CM | POA: Insufficient documentation

## 2012-06-04 DIAGNOSIS — R1310 Dysphagia, unspecified: Secondary | ICD-10-CM | POA: Insufficient documentation

## 2012-06-04 DIAGNOSIS — Z1211 Encounter for screening for malignant neoplasm of colon: Secondary | ICD-10-CM

## 2012-06-04 DIAGNOSIS — K2990 Gastroduodenitis, unspecified, without bleeding: Secondary | ICD-10-CM

## 2012-06-04 DIAGNOSIS — K222 Esophageal obstruction: Secondary | ICD-10-CM | POA: Insufficient documentation

## 2012-06-04 DIAGNOSIS — K3189 Other diseases of stomach and duodenum: Secondary | ICD-10-CM | POA: Insufficient documentation

## 2012-06-04 DIAGNOSIS — I10 Essential (primary) hypertension: Secondary | ICD-10-CM | POA: Insufficient documentation

## 2012-06-04 HISTORY — PX: ESOPHAGOGASTRODUODENOSCOPY (EGD) WITH PROPOFOL: SHX5813

## 2012-06-04 HISTORY — PX: POLYPECTOMY: SHX5525

## 2012-06-04 HISTORY — PX: COLONOSCOPY WITH PROPOFOL: SHX5780

## 2012-06-04 HISTORY — PX: SAVORY DILATION: SHX5439

## 2012-06-04 SURGERY — ESOPHAGOGASTRODUODENOSCOPY (EGD) WITH PROPOFOL
Anesthesia: Monitor Anesthesia Care

## 2012-06-04 SURGERY — COLONOSCOPY WITH PROPOFOL
Anesthesia: Monitor Anesthesia Care | Site: Esophagus

## 2012-06-04 MED ORDER — STERILE WATER FOR IRRIGATION IR SOLN
Status: DC | PRN
  Administered 2012-06-04: 08:00:00

## 2012-06-04 MED ORDER — BUTAMBEN-TETRACAINE-BENZOCAINE 2-2-14 % EX AERO
1.0000 | INHALATION_SPRAY | Freq: Once | CUTANEOUS | Status: AC
  Administered 2012-06-04: 1 via TOPICAL
  Filled 2012-06-04: qty 56

## 2012-06-04 MED ORDER — FENTANYL CITRATE 0.05 MG/ML IJ SOLN
INTRAMUSCULAR | Status: DC | PRN
  Administered 2012-06-04: 25 ug via INTRAVENOUS
  Administered 2012-06-04: 50 ug via INTRAVENOUS
  Administered 2012-06-04: 25 ug via INTRAVENOUS

## 2012-06-04 MED ORDER — LACTATED RINGERS IV SOLN
INTRAVENOUS | Status: DC
  Administered 2012-06-04: 1000 mL via INTRAVENOUS

## 2012-06-04 MED ORDER — PROPOFOL INFUSION 10 MG/ML OPTIME
INTRAVENOUS | Status: DC | PRN
  Administered 2012-06-04: 150 ug/kg/min via INTRAVENOUS
  Administered 2012-06-04: 09:00:00 via INTRAVENOUS

## 2012-06-04 MED ORDER — ONDANSETRON HCL 4 MG/2ML IJ SOLN: 4.0000 mg | Freq: Once | INTRAMUSCULAR | Status: DC | PRN

## 2012-06-04 MED ORDER — MIDAZOLAM HCL 2 MG/2ML IJ SOLN
INTRAMUSCULAR | Status: AC
  Filled 2012-06-04: qty 2

## 2012-06-04 MED ORDER — FENTANYL CITRATE 0.05 MG/ML IJ SOLN: 25.0000 ug | INTRAMUSCULAR | Status: DC | PRN

## 2012-06-04 MED ORDER — FENTANYL CITRATE 0.05 MG/ML IJ SOLN
INTRAMUSCULAR | Status: AC
  Filled 2012-06-04: qty 2

## 2012-06-04 MED ORDER — PROPOFOL 10 MG/ML IV EMUL
INTRAVENOUS | Status: AC
  Filled 2012-06-04: qty 20

## 2012-06-04 MED ORDER — LIDOCAINE HCL (PF) 1 % IJ SOLN
INTRAMUSCULAR | Status: AC
  Filled 2012-06-04: qty 5

## 2012-06-04 MED ORDER — MIDAZOLAM HCL 2 MG/2ML IJ SOLN
1.0000 mg | INTRAMUSCULAR | Status: DC | PRN
  Administered 2012-06-04: 2 mg via INTRAVENOUS

## 2012-06-04 MED ORDER — GLYCOPYRROLATE 0.2 MG/ML IJ SOLN
INTRAMUSCULAR | Status: AC
  Filled 2012-06-04: qty 1

## 2012-06-04 MED ORDER — GLYCOPYRROLATE 0.2 MG/ML IJ SOLN
0.2000 mg | Freq: Once | INTRAMUSCULAR | Status: AC
  Administered 2012-06-04: 0.2 mg via INTRAVENOUS

## 2012-06-04 MED ORDER — ONDANSETRON HCL 4 MG/2ML IJ SOLN
4.0000 mg | Freq: Once | INTRAMUSCULAR | Status: AC
  Administered 2012-06-04: 4 mg via INTRAVENOUS

## 2012-06-04 MED ORDER — MINERAL OIL LIGHT 100 % EX OIL
TOPICAL_OIL | CUTANEOUS | Status: AC | PRN
  Administered 2012-06-04: 1 via TOPICAL

## 2012-06-04 MED ORDER — ONDANSETRON HCL 4 MG/2ML IJ SOLN
INTRAMUSCULAR | Status: AC
  Filled 2012-06-04: qty 2

## 2012-06-04 MED ORDER — WATER FOR IRRIGATION, STERILE IR SOLN
Status: DC | PRN
  Administered 2012-06-04: 1000 mL

## 2012-06-04 SURGICAL SUPPLY — 21 items
BLOCK BITE 60FR ADLT L/F BLUE (MISCELLANEOUS) ×3 IMPLANT
ELECT REM PT RETURN 9FT ADLT (ELECTROSURGICAL)
ELECTRODE REM PT RTRN 9FT ADLT (ELECTROSURGICAL) IMPLANT
FLOOR PAD 36X40 (MISCELLANEOUS) ×3
FORCEPS BIOP RAD 4 LRG CAP 4 (CUTTING FORCEPS) ×2 IMPLANT
INJECTOR/SNARE I SNARE (MISCELLANEOUS) IMPLANT
LUBRICANT JELLY 4.5OZ STERILE (MISCELLANEOUS) ×1 IMPLANT
MANIFOLD NEPTUNE II (INSTRUMENTS) ×3 IMPLANT
NDL SCLEROTHERAPY 25GX240 (NEEDLE) IMPLANT
NEEDLE SCLEROTHERAPY 25GX240 (NEEDLE) IMPLANT
PAD FLOOR 36X40 (MISCELLANEOUS) ×2 IMPLANT
PROBE APC STR FIRE (PROBE) IMPLANT
PROBE INJECTION GOLD (MISCELLANEOUS)
PROBE INJECTION GOLD 7FR (MISCELLANEOUS) IMPLANT
SNARE ROTATE MED OVAL 20MM (MISCELLANEOUS) IMPLANT
SNARE SHORT THROW 13M SML OVAL (MISCELLANEOUS) ×2 IMPLANT
SYR 50ML LL SCALE MARK (SYRINGE) IMPLANT
TRAP SPECIMEN MUCOUS 40CC (MISCELLANEOUS) IMPLANT
TUBING ENDO SMARTCAP PENTAX (MISCELLANEOUS) ×3 IMPLANT
TUBING IRRIGATION ENDOGATOR (MISCELLANEOUS) ×3 IMPLANT
WATER STERILE IRR 1000ML POUR (IV SOLUTION) ×2 IMPLANT

## 2012-06-04 NOTE — H&P (Signed)
Primary Care Physician:  HAWKINS,EDWARD L, MD Primary Gastroenterologist:  Dr. Fields  Pre-Procedure History & Physical: HPI:  Sharon Spencer is a 61 y.o. female here for DYSPHAGIA/screening.  Past Medical History  Diagnosis Date  . TIA (transient ischemic attack) 1991, 2006  . Lumbago   . Rheumatoid aortitis     sepcialist at [facility], Dr. Kate Mitchell  . Anxiety   . Depression   . Osteoporosis   . HTN (hypertension)   . Recurrent UTI     specialist at [facility]  . Migraine   . Carpal tunnel syndrome   . Myalgia and myositis, unspecified   . Fibromyalgia   . Arthritis     rheumatoid  . PONV (postoperative nausea and vomiting)     Past Surgical History  Procedure Date  . Back surgery     lower  . Shoulder surgery     left  . Rotator cuff repair   . Tonsillectomy   . Nasal sinus surgery     x3  . Tubal ligation   . Partial hysterectomy   . Colonoscopy     Dr. Rehman--> over ten years ago, ?colitis per patient? records have been requested 05/2012.     Prior to Admission medications   Medication Sig Start Date End Date Taking? Authorizing Provider  adalimumab (HUMIRA) 40 MG/0.8ML injection Inject 40 mg into the skin every 14 (fourteen) days.   Yes Historical Provider, MD  alendronate (FOSAMAX) 70 MG tablet Take 70 mg by mouth every 7 (seven) days.  04/22/12  Yes Historical Provider, MD  ALPRAZolam (XANAX) 0.5 MG tablet Take 0.5 mg by mouth at bedtime as needed. Hypertension. 05/01/12  Yes Historical Provider, MD  amLODipine (NORVASC) 10 MG tablet Take 10 mg by mouth daily.  05/07/12  Yes Historical Provider, MD  atenolol (TENORMIN) 50 MG tablet Take 25 mg by mouth daily.   Yes Historical Provider, MD  clobetasol cream (TEMOVATE) 0.05 % Apply 1 application topically 2 (two) times daily.  04/24/12  Yes Historical Provider, MD  CYMBALTA 60 MG capsule Take 60 mg by mouth daily.  03/08/12  Yes Historical Provider, MD  HYDROcodone-acetaminophen (LORTAB) 10-500 MG per tablet Take 1  tablet by mouth every 6 (six) hours as needed. Pain.   Yes Historical Provider, MD  hydroxychloroquine (PLAQUENIL) 200 MG tablet Take 400 mg by mouth daily.  05/07/12  Yes Historical Provider, MD  pantoprazole (PROTONIX) 40 MG tablet Take 40 mg by mouth daily.  05/11/12  Yes Historical Provider, MD  predniSONE (STERAPRED UNI-PAK) 5 MG TABS Take 5 mg by mouth daily.   Yes Historical Provider, MD  tiZANidine (ZANAFLEX) 4 MG tablet Take 4-8 mg by mouth at bedtime.  05/07/12  Yes Historical Provider, MD    Allergies as of 05/27/2012 - Review Complete 05/27/2012  Allergen Reaction Noted  . Compazine (prochlorperazine edisylate) Other (See Comments) 05/22/2012  . Sulfa antibiotics Nausea And Vomiting 05/22/2012    Family History  Problem Relation Age of Onset  . Colon cancer Neg Hx   . Diabetes Father   . Kidney failure Father   . Breast cancer Mother   . Stroke Mother   . Diabetes      siblings  . Lymphoma Sister   . Liver disease      history of elevated LFTs in sister too    History   Social History  . Marital Status: Married    Spouse Name: N/A    Number of Children: 1  .   Years of Education: N/A   Occupational History  . disability    Social History Main Topics  . Smoking status: Never Smoker   . Smokeless tobacco: Not on file  . Alcohol Use: No  . Drug Use: No  . Sexually Active: Not on file   Other Topics Concern  . Not on file   Social History Narrative  . No narrative on file    Review of Systems: See HPI, otherwise negative ROS   Physical Exam: BP 122/88  Pulse 64  Temp 97.8 F (36.6 C) (Oral)  Resp 11  SpO2 93% General:   Alert,  pleasant and cooperative in NAD Head:  Normocephalic and atraumatic. Neck:  Supple; Lungs:  Clear throughout to auscultation.    Heart:  Regular rate and rhythm. Abdomen:  Soft, nontender and nondistended. Normal bowel sounds, without guarding, and without rebound.   Neurologic:  Alert and  oriented x4;  grossly normal  neurologically.  Impression/Plan:     DYSPHAGIA/screening  PLAN:  EGD/DIL/tcs TODAY  

## 2012-06-04 NOTE — Op Note (Signed)
[facility] [address] [place], [address]   ENDOSCOPY PROCEDURE REPORT  PATIENT: Sharon Spencer, Sharon Spencer.  MR#: [id number] BIRTHDATE: [birth year] , 61  yrs. old GENDER: Female  ENDOSCOPIST: Jonette Eva, MD REFFERED LK:[id number] Juanetta Gosling, M.D.  PROCEDURE DATE:  06/04/2012 PROCEDURE:   EGD with biopsy and EGD with dilatation over guidewire   INDICATIONS:1.  dysphagia.   2.  dyspepsia. MEDICATIONS: MAC sedation, administered by CRNA TOPICAL ANESTHETIC: Cetacaine Spray  DESCRIPTION OF PROCEDURE:   After the risks benefits and alternatives of the procedure were thoroughly explained, informed consent was obtained.  The     endoscope was introduced through the mouth and advanced to the second portion of the duodenum.  The instrument was slowly withdrawn as the mucosa was carefully examined.  Prior to withdrawal of the scope, the guidwire was placed.  The esophagus was dilated successfully.  The patient was recovered in endoscopy and discharged home in satisfactory condition.      SIGMOID ESOPHAGUS(DISTAL).  ESOPHAGUS: A moderately severe Schatzki ring was found at the gastroesophageal junction.  STOMACH: Moderate non-erosive gastritis (inflammation) was found in the gastric antrum.  Multiple biopsies were performed.  DUODENUM: The duodenal mucosa showed no abnormalities in the bulb and second portion of the duodenum.   Dilation was then performed at the gastroesphageal junction  Dilator: Savary over guidewire Size(s): 12-14MM Resistance: moderate  COMPLICATIONS: There were no complications.   ENDOSCOPIC IMPRESSION: 1.   SIGMOID ESOPHAGUS(DISTAL) 2.   Schatzki ring was found at the gastroesophageal junction 3.   Non-erosive gastritis (inflammation) was found in the gastric antrum; multiple biopsies 4.   The duodenal mucosa showed no abnormalities in the bulb and second portion of the duodenum  RECOMMENDATIONS: CONTINUE PROTONIX 30 MINUTES PRIOR TO YOUR  FIRST MEAL.  FOLLOW A SOFT MECHANICAL/LOW FAT DIET.  AVOID ITEMS THAT CAUSE BLOATING.  MEATS SHOULD BE CHOPPED OR GROUND ONLY.  BIOPSY WILL BE BACK IN 7 DAYS.  ANOTHER DILATION IN 2 WEEKS.  Follow up in 4 mos.      _______________________________ Gwendlyn Deutscher, MD 06/04/2012 3:55 PM      PATIENT NAME:  Sharon Spencer. MR#: [id number]

## 2012-06-04 NOTE — Telephone Encounter (Signed)
Short Stay nurse called for [place] to set up another EGD with Dilation in 2 weeks and I will NIC OV to FU in 4 months

## 2012-06-04 NOTE — Anesthesia Postprocedure Evaluation (Signed)
Anesthesia Post Note  Patient: Sharon Spencer  Procedure(s) Performed: Procedure(s) (LRB): COLONOSCOPY WITH PROPOFOL (N/A) ESOPHAGOGASTRODUODENOSCOPY (EGD) WITH PROPOFOL (N/A) SAVORY DILATION (N/A) POLYPECTOMY (N/A)  Anesthesia type: MAC  Patient location: PACU  Post pain: Pain level controlled  Post assessment: Post-op Vital signs reviewed, Patient's Cardiovascular Status Stable, Respiratory Function Stable, Patent Airway, No signs of Nausea or vomiting and Pain level controlled  Last Vitals:  Filed Vitals:   06/04/12 0925  BP: 118/74  Pulse: 76  Temp: 36.5 C  Resp: 12    Post vital signs: Reviewed and stable  Level of consciousness: awake and alert   Complications: No apparent anesthesia complications

## 2012-06-04 NOTE — Anesthesia Procedure Notes (Signed)
Procedure Name: MAC Date/Time: 06/04/2012 8:26 AM Performed by: Franco Nones Pre-anesthesia Checklist: Patient identified, Emergency Drugs available, Suction available, Timeout performed and Patient being monitored Patient Re-evaluated:Patient Re-evaluated prior to inductionOxygen Delivery Method: Non-rebreather mask

## 2012-06-04 NOTE — Op Note (Signed)
[facility] [address] [place], [address]   COLONOSCOPY PROCEDURE REPORT  PATIENT: Sharon Spencer, Sharon Spencer.  MR#: [id number] BIRTHDATE: 11/26/1950 , 61  yrs. old GENDER: Female ENDOSCOPIST: Jonette Eva, MD REFERRED XB:[id number] Juanetta Gosling, M.D. PROCEDURE DATE:  06/04/2012 PROCEDURE:   Colonoscopy with cold biopsy polypectomy INDICATIONS:Average risk patient for colon cancer. MEDICATIONS: MAC sedation, administered by CRNA  DESCRIPTION OF PROCEDURE:    Physical exam was performed.  Informed consent was obtained from the patient after explaining the benefits, risks, and alternatives to procedure.  The patient was connected to monitor and placed in left lateral position. Continuous oxygen was provided by nasal cannula and IV medicine administered through an indwelling cannula.  After administration of sedation and rectal exam, the patients rectum was intubated and the     colonoscope was advanced under direct visualization to the cecum.  The scope was removed slowly by carefully examining the color, texture, anatomy, and integrity mucosa on the way out.  The patient was recovered in endoscopy and discharged home in satisfactory condition.       COLON FINDINGS: A sessile polyp measuring 3 mm in size was found in the ascending colon.  A polypectomy was performed with cold forceps.  PREP QUALITY: good. CECAL W/D TIME: 14 minutes  COMPLICATIONS: None  ENDOSCOPIC IMPRESSION: Sessile polyp measuring 3 mm in size was found in the ascending colon; polypectomy was performed with cold forceps   RECOMMENDATIONS: Await biopsy HIGH FIBER DIET TCS IN 10 YEARS       _______________________________ eSignedJonette Eva, MD 06/04/2012 3:45 PM

## 2012-06-04 NOTE — Anesthesia Preprocedure Evaluation (Signed)
Anesthesia Evaluation  Patient identified by MRN, date of birth, ID band Patient awake    Reviewed: Allergy & Precautions, H&P , NPO status , Patient's Chart, lab work & pertinent test results  History of Anesthesia Complications (+) PONV  Airway Mallampati: III TM Distance: >3 FB Neck ROM: Limited  Mouth opening: Limited Mouth Opening  Dental  (+) Teeth Intact   Pulmonary neg pulmonary ROS,  breath sounds clear to auscultation        Cardiovascular hypertension, Pt. on medications Rhythm:Regular Rate:Normal     Neuro/Psych  Headaches, PSYCHIATRIC DISORDERS Anxiety Depression TIA   GI/Hepatic GERD-  Medicated,  Endo/Other    Renal/GU      Musculoskeletal  (+) Arthritis -, Rheumatoid disorders,  Fibromyalgia -  Abdominal   Peds  Hematology   Anesthesia Other Findings   Reproductive/Obstetrics                           Anesthesia Physical Anesthesia Plan  ASA: III  Anesthesia Plan: MAC   Post-op Pain Management:    Induction: Intravenous  Airway Management Planned: Simple Face Mask  Additional Equipment:   Intra-op Plan:   Post-operative Plan:   Informed Consent: I have reviewed the patients History and Physical, chart, labs and discussed the procedure including the risks, benefits and alternatives for the proposed anesthesia with the patient or authorized representative who has indicated his/her understanding and acceptance.     Plan Discussed with:   Anesthesia Plan Comments:         Anesthesia Quick Evaluation

## 2012-06-04 NOTE — Transfer of Care (Signed)
Immediate Anesthesia Transfer of Care Note  Patient: Sharon Spencer  Procedure(s) Performed: Procedure(s) (LRB): COLONOSCOPY WITH PROPOFOL (N/A) ESOPHAGOGASTRODUODENOSCOPY (EGD) WITH PROPOFOL (N/A) SAVORY DILATION (N/A) POLYPECTOMY (N/A)  Patient Location: PACU  Anesthesia Type: MAC  Level of Consciousness: awake  Airway & Oxygen Therapy: Patient Spontanous Breathing.   Post-op Assessment: Report given to PACU RN, Post -op Vital signs reviewed and stable and Patient moving all extremities  Post vital signs: Reviewed and stable  Complications: No apparent anesthesia complications

## 2012-06-04 NOTE — Telephone Encounter (Signed)
Patient is scheduled with [facility] for EGD/ED on Monday Dec 2nd at 10:15 and I have mailed her instructions and she is aware

## 2012-06-05 ENCOUNTER — Encounter (HOSPITAL_COMMUNITY): Payer: Self-pay | Admitting: Pharmacy Technician

## 2012-06-05 ENCOUNTER — Encounter (HOSPITAL_COMMUNITY): Payer: Self-pay | Admitting: Gastroenterology

## 2012-06-07 ENCOUNTER — Encounter: Payer: Self-pay | Admitting: Gastroenterology

## 2012-06-07 NOTE — Progress Notes (Signed)
Labs from [facility], September 2013. Sodium 136, BUN 18, creatinine 0.9, calcium 9.9, glucose 133, ALT 44, sedimentation rate 28, hemoglobin 15.7, MCV 89, platelets 409,000, white blood cell count 14,100.

## 2012-06-12 ENCOUNTER — Telehealth: Payer: Self-pay | Admitting: Gastroenterology

## 2012-06-12 NOTE — Telephone Encounter (Signed)
REVIEWED.  

## 2012-06-12 NOTE — Telephone Encounter (Signed)
Path faxed to PCP, recall made 

## 2012-06-12 NOTE — Telephone Encounter (Signed)
Please call pt. Sharon Spencer stomach Bx shows gastritis. sHe had A simple adenoma removed from Sharon Spencer colon.  PROTONIX 30 MINUTES PRIOR TO MEALS. REPEAT EGD/DIL FIRST WEEK IN DEC. SOFT MECH/LOW FAT DIET OPV IN 4 MOS TCS IN 10 YEARS

## 2012-06-12 NOTE — Telephone Encounter (Signed)
Patient is scheduled for Dec 2nd at 10:15

## 2012-06-12 NOTE — Telephone Encounter (Signed)
Called and informed pt.  

## 2012-06-17 ENCOUNTER — Encounter (HOSPITAL_COMMUNITY)
Admission: RE | Disposition: A | Discharge status: Home / Self Care | Payer: Self-pay | Source: Ambulatory Visit | Attending: Gastroenterology

## 2012-06-17 ENCOUNTER — Encounter (HOSPITAL_COMMUNITY): Payer: Self-pay | Admitting: *Deleted

## 2012-06-17 ENCOUNTER — Ambulatory Visit (HOSPITAL_COMMUNITY)
Admission: RE | Admit: 2012-06-17 | Discharge: 2012-06-17 | Disposition: A | Discharge status: Home / Self Care | Payer: Medicare Other | Source: Ambulatory Visit | Attending: Gastroenterology | Admitting: Gastroenterology

## 2012-06-17 DIAGNOSIS — I10 Essential (primary) hypertension: Secondary | ICD-10-CM | POA: Insufficient documentation

## 2012-06-17 DIAGNOSIS — K2970 Gastritis, unspecified, without bleeding: Secondary | ICD-10-CM

## 2012-06-17 DIAGNOSIS — K2990 Gastroduodenitis, unspecified, without bleeding: Secondary | ICD-10-CM

## 2012-06-17 DIAGNOSIS — K222 Esophageal obstruction: Secondary | ICD-10-CM

## 2012-06-17 DIAGNOSIS — R1310 Dysphagia, unspecified: Secondary | ICD-10-CM | POA: Insufficient documentation

## 2012-06-17 HISTORY — PX: ESOPHAGOGASTRODUODENOSCOPY (EGD) WITH ESOPHAGEAL DILATION: SHX5812

## 2012-06-17 HISTORY — DX: Chronic sinusitis, unspecified: J32.9

## 2012-06-17 SURGERY — ESOPHAGOGASTRODUODENOSCOPY (EGD) WITH ESOPHAGEAL DILATION
Anesthesia: Moderate Sedation

## 2012-06-17 MED ORDER — PROMETHAZINE HCL 25 MG/ML IJ SOLN
INTRAMUSCULAR | Status: AC
  Filled 2012-06-17: qty 1

## 2012-06-17 MED ORDER — MINERAL OIL PO OIL
TOPICAL_OIL | ORAL | Status: AC
  Filled 2012-06-17: qty 30

## 2012-06-17 MED ORDER — SODIUM CHLORIDE 0.45 % IV SOLN
INTRAVENOUS | Status: DC
  Administered 2012-06-17: 10:00:00 via INTRAVENOUS

## 2012-06-17 MED ORDER — MEPERIDINE HCL 100 MG/ML IJ SOLN
INTRAMUSCULAR | Status: DC | PRN
  Administered 2012-06-17: 50 mg via INTRAVENOUS
  Administered 2012-06-17: 25 mg via INTRAVENOUS
  Administered 2012-06-17: 50 mg via INTRAVENOUS

## 2012-06-17 MED ORDER — BUTAMBEN-TETRACAINE-BENZOCAINE 2-2-14 % EX AERO
INHALATION_SPRAY | CUTANEOUS | Status: DC | PRN
  Administered 2012-06-17: 1 via TOPICAL

## 2012-06-17 MED ORDER — MIDAZOLAM HCL 5 MG/5ML IJ SOLN
INTRAMUSCULAR | Status: DC | PRN
  Administered 2012-06-17 (×3): 2 mg via INTRAVENOUS

## 2012-06-17 MED ORDER — STERILE WATER FOR IRRIGATION IR SOLN
Status: DC | PRN
  Administered 2012-06-17: 11:00:00

## 2012-06-17 MED ORDER — MEPERIDINE HCL 100 MG/ML IJ SOLN
INTRAMUSCULAR | Status: AC
  Filled 2012-06-17: qty 2

## 2012-06-17 MED ORDER — PROMETHAZINE HCL 25 MG/ML IJ SOLN
INTRAMUSCULAR | Status: DC | PRN
  Administered 2012-06-17: 12.5 mg via INTRAVENOUS

## 2012-06-17 MED ORDER — MIDAZOLAM HCL 5 MG/5ML IJ SOLN
INTRAMUSCULAR | Status: AC
  Filled 2012-06-17: qty 10

## 2012-06-17 NOTE — H&P (View-Only) (Signed)
Primary Care Physician:  Fredirick Maudlin, MD Primary Gastroenterologist:  Dr. Darrick Penna  Pre-Procedure History & Physical: HPI:  Sharon Spencer is a 61 y.o. female here for DYSPHAGIA/screening.  Past Medical History  Diagnosis Date  . TIA (transient ischemic attack) 1991, 2006  . Lumbago   . Rheumatoid aortitis     sepcialist at [facility], Dr. Sigurd Sos  . Anxiety   . Depression   . Osteoporosis   . HTN (hypertension)   . Recurrent UTI     specialist at [facility]  . Migraine   . Carpal tunnel syndrome   . Myalgia and myositis, unspecified   . Fibromyalgia   . Arthritis     rheumatoid  . PONV (postoperative nausea and vomiting)     Past Surgical History  Procedure Date  . Back surgery     lower  . Shoulder surgery     left  . Rotator cuff repair   . Tonsillectomy   . Nasal sinus surgery     x3  . Tubal ligation   . Partial hysterectomy   . Colonoscopy     Dr. Karilyn Cota over ten years ago, ?colitis per patient? records have been requested 05/2012.     Prior to Admission medications   Medication Sig Start Date End Date Taking? Authorizing Provider  adalimumab (HUMIRA) 40 MG/0.8ML injection Inject 40 mg into the skin every 14 (fourteen) days.   Yes Historical Provider, MD  alendronate (FOSAMAX) 70 MG tablet Take 70 mg by mouth every 7 (seven) days.  04/22/12  Yes Historical Provider, MD  ALPRAZolam Prudy Feeler) 0.5 MG tablet Take 0.5 mg by mouth at bedtime as needed. Hypertension. 05/01/12  Yes Historical Provider, MD  amLODipine (NORVASC) 10 MG tablet Take 10 mg by mouth daily.  05/07/12  Yes Historical Provider, MD  atenolol (TENORMIN) 50 MG tablet Take 25 mg by mouth daily.   Yes Historical Provider, MD  clobetasol cream (TEMOVATE) 0.05 % Apply 1 application topically 2 (two) times daily.  04/24/12  Yes Historical Provider, MD  CYMBALTA 60 MG capsule Take 60 mg by mouth daily.  03/08/12  Yes Historical Provider, MD  HYDROcodone-acetaminophen (LORTAB) 10-500 MG per tablet Take 1  tablet by mouth every 6 (six) hours as needed. Pain.   Yes Historical Provider, MD  hydroxychloroquine (PLAQUENIL) 200 MG tablet Take 400 mg by mouth daily.  05/07/12  Yes Historical Provider, MD  pantoprazole (PROTONIX) 40 MG tablet Take 40 mg by mouth daily.  05/11/12  Yes Historical Provider, MD  predniSONE (STERAPRED UNI-PAK) 5 MG TABS Take 5 mg by mouth daily.   Yes Historical Provider, MD  tiZANidine (ZANAFLEX) 4 MG tablet Take 4-8 mg by mouth at bedtime.  05/07/12  Yes Historical Provider, MD    Allergies as of 05/27/2012 - Review Complete 05/27/2012  Allergen Reaction Noted  . Compazine (prochlorperazine edisylate) Other (See Comments) 05/22/2012  . Sulfa antibiotics Nausea And Vomiting 05/22/2012    Family History  Problem Relation Age of Onset  . Colon cancer Neg Hx   . Diabetes Father   . Kidney failure Father   . Breast cancer Mother   . Stroke Mother   . Diabetes      siblings  . Lymphoma Sister   . Liver disease      history of elevated LFTs in sister too    History   Social History  . Marital Status: Married    Spouse Name: N/A    Number of Children: 1  .  Years of Education: N/A   Occupational History  . disability    Social History Main Topics  . Smoking status: Never Smoker   . Smokeless tobacco: Not on file  . Alcohol Use: No  . Drug Use: No  . Sexually Active: Not on file   Other Topics Concern  . Not on file   Social History Narrative  . No narrative on file    Review of Systems: See HPI, otherwise negative ROS   Physical Exam: BP 122/88  Pulse 64  Temp 97.8 F (36.6 C) (Oral)  Resp 11  SpO2 93% General:   Alert,  pleasant and cooperative in NAD Head:  Normocephalic and atraumatic. Neck:  Supple; Lungs:  Clear throughout to auscultation.    Heart:  Regular rate and rhythm. Abdomen:  Soft, nontender and nondistended. Normal bowel sounds, without guarding, and without rebound.   Neurologic:  Alert and  oriented x4;  grossly normal  neurologically.  Impression/Plan:     DYSPHAGIA/screening  PLAN:  EGD/DIL/tcs TODAY

## 2012-06-17 NOTE — Interval H&P Note (Signed)
History and Physical Interval Note:  06/17/2012 10:26 AM  Sharon Spencer  has presented today for surgery, with the diagnosis of Dysphagia  The various methods of treatment have been discussed with the patient and family. After consideration of risks, benefits and other options for treatment, the patient has consented to  Procedure(s) (LRB) with comments: ESOPHAGOGASTRODUODENOSCOPY (EGD) WITH ESOPHAGEAL DILATION (N/A) - 10:15 as a surgical intervention .  The patient's history has been reviewed, patient examined, no change in status, stable for surgery.  I have reviewed the patient's chart and labs.  Questions were answered to the patient's satisfaction.     [employer]

## 2012-06-18 NOTE — Op Note (Signed)
[facility] [address] [place], [address]   ENDOSCOPY PROCEDURE REPORT  PATIENT: Briseis, Aguilera.  MR#: [id number] BIRTHDATE: 01/13/1951 , 61  yrs. old GENDER: Female  ENDOSCOPIST: Jonette Eva, MD REFFERED HQ:IONGEX Juanetta Gosling, M.D.  PROCEDURE DATE:  06/17/2012 PROCEDURE:   EGD with dilatation over guidewire  INDICATIONS:1.  dysphagia. MEDICATIONS: Demerol 125 mg IV, Versed 6 mg IV, and Promethazine (Phenergan) 12.5mg  IV TOPICAL ANESTHETIC: Cetacaine Spray  DESCRIPTION OF PROCEDURE:   After the risks benefits and alternatives of the procedure were thoroughly explained, informed consent was obtained.  The EG-[id number] ([id number])  endoscope was introduced through the mouth and advanced to the second portion of the duodenum. The instrument was slowly withdrawn as the mucosa was carefully examined.  Prior to withdrawal of the scope, the guidwire was placed.  The esophagus was dilated successfully.  The patient was recovered in endoscopy and discharged home in satisfactory condition.      ESOPHAGUS: A moderately severe Schatzki ring was found at the gastroesophageal junction.  STOMACH: Mild non-erosive gastritis (inflammation) was found in the gastric antrum.  DUODENUM: The duodenal mucosa showed no abnormalities in the bulb and second portion of the duodenum.   Dilation was then performed at the gastroesphageal junction  Dilator: Savary over guidewire Size(s): 14-16 mm Resistance: moderate PASSED 14-15 MM DILATORS WITH MODERATE RESISTANCE. NO HEME. WIRE AND DILATOR REMOVED. ESOPHAGUS INTUBATED AND SCOPE PASSED TO THE PYLORUS. NO MUCOSAL INTERRUPTION APPRECIATED. 16 MM DILATOR PASSED WITH MODERATE RESISTANT SLIGHT HEME ON DILATOR.  COMPLICATIONS: There were no complications.  ENDOSCOPIC IMPRESSION: 1.   Schatzki ring was found at the gastroesophageal junction 2.   Non-erosive gastritis (inflammation) was found in the gastric antrum 3.   The duodenal  mucosa showed no abnormalities in the bulb and second portion of the duodenum  RECOMMENDATIONS: PROTONIX DAILY LOW FAT/HIGH FIBER DIET OPV MAR 2014.  CONSIDER EGD/DIL IF SX NOT RESOLVED.      _______________________________ Rosalie DoctorJonette Eva, MD 06/17/2012 2:51 PM      PATIENT NAME:  Neda, [place]enbring. MR#: [id number]

## 2012-06-19 ENCOUNTER — Telehealth: Payer: Self-pay | Admitting: Gastroenterology

## 2012-06-19 NOTE — Telephone Encounter (Signed)
REVIEWED. AGREE. 

## 2012-06-19 NOTE — Telephone Encounter (Signed)
PLEASE CALL PT. TO TREAT HER CONSTIPATION:  SHE SHOULD FOLLOW A HIGH FIBER DIET.  DRINK WATER TO KEEP HER URINE LIGHT YELLOW.  TO TREAT HER CONSTIPATION, SHE MAY USE MOM ONCE OR TWICE A DAY. IF THAT FAILS TO IMPROVE HER SX AFTER 2 MOS. THEN SHE SHOULD CALL ME.  FOLLOW UP IN MAR 2014.

## 2012-06-19 NOTE — Telephone Encounter (Signed)
Called and informed pt.  

## 2012-06-19 NOTE — Telephone Encounter (Signed)
Pt can't remember what SF told her after her procedure on Monday regarding her medicines. Please call her back at [phone]

## 2012-06-19 NOTE — Telephone Encounter (Signed)
I called pt and read the recommendations from Dr. Darrick Penna. She said she told her something she could use for constipation other than fiber, if it was needed, but she does not remember what it was.

## 2012-06-25 ENCOUNTER — Encounter (HOSPITAL_COMMUNITY): Payer: Self-pay | Admitting: Gastroenterology

## 2012-07-25 NOTE — Progress Notes (Signed)
REVIEWED.  NOV 2013 EGD/DIL SCHATZKI'S RING, TCS SIMPLE ADENOMA

## 2012-08-31 ENCOUNTER — Other Ambulatory Visit: Payer: Self-pay

## 2012-09-19 ENCOUNTER — Encounter: Payer: Self-pay | Admitting: *Deleted

## 2012-10-21 ENCOUNTER — Encounter: Payer: Self-pay | Admitting: Gastroenterology

## 2012-10-24 ENCOUNTER — Encounter: Payer: Self-pay | Admitting: Gastroenterology

## 2012-10-24 ENCOUNTER — Ambulatory Visit (INDEPENDENT_AMBULATORY_CARE_PROVIDER_SITE_OTHER): Payer: Medicare Other | Admitting: Gastroenterology

## 2012-10-24 VITALS — BP 134/78 | HR 63 | Temp 98.4°F | Ht 61.0 in | Wt 159.0 lb

## 2012-10-24 DIAGNOSIS — K219 Gastro-esophageal reflux disease without esophagitis: Secondary | ICD-10-CM

## 2012-10-24 DIAGNOSIS — K5900 Constipation, unspecified: Secondary | ICD-10-CM

## 2012-10-24 DIAGNOSIS — R1310 Dysphagia, unspecified: Secondary | ICD-10-CM

## 2012-10-24 MED ORDER — PANTOPRAZOLE SODIUM 40 MG PO TBEC: DELAYED_RELEASE_TABLET | ORAL | Status: DC

## 2012-10-24 MED ORDER — LINACLOTIDE 145 MCG PO CAPS: ORAL_CAPSULE | ORAL | Status: DC

## 2012-10-24 NOTE — Assessment & Plan Note (Signed)
SX IMPROVED BUT NOT IDEALLY CONTROLLED. LOST 11 LBS ON A LOW FAT DIET/PORTION CONTROL.  LOW FAT DIET INCREASE PROTONIX TO BID CONTINUE WEIGHT LOSS EFFORTS OPV IN 4 MOS

## 2012-10-24 NOTE — Progress Notes (Signed)
Cc PCP 

## 2012-10-24 NOTE — Assessment & Plan Note (Signed)
SX NOT IDEALLY CONTROLLED WITH WATER, FIBER, AND OTC MEDS.  DRINK WATER EAT FIBER ADD LINZESS 145 MCG QAM. PT WILL CALL IF HIGH CO-PAY OPV IN 4 MOS

## 2012-10-24 NOTE — Assessment & Plan Note (Signed)
SX IMPROVED.  MONITOR FOR RECURRENT SYMPTOMS.

## 2012-10-24 NOTE — Progress Notes (Signed)
Subjective:    Patient ID: Sharon Spencer, female    DOB: [birth year], 62 y.o.   MRN: [id number]  PCP: [facility]  HPI WATCHING WHAT SHE IS EATING. ONLY A FEW BAD NIGHTS. CHOKING HAD A FEW SPELLS BUT NOTHING CONTINUOUS. EATS SMALL AMOUNT AND EATS SLOWER. LOST 11 LBS SINCE NOV 2013. HUSBAND HAD GAB 6 YEARS AGO & HAS SEEN ME IN THE PAST. STILL HAVING PROBLEMS WITH CONSTIPATION AFTER ADDING WATER AND FIBER. TRIED STOOL SOFTENER. MOM MAKES HER NAUSEOUS. TOOK DULCOLAX CAUSES DIARRHEA IF SHE TAKES TWO. CAN TAKE ONE BUT NOT A SATISFACTORY BM. NEVER TRIED AMITIZA OR LINZESS. HAS A BM Q2-3 DAYS BUT MAY GET FULL AND UNCOMFORTABLE AND PLACE ON RIGHT SIDE.  Past Medical History  Diagnosis Date  . TIA (transient ischemic attack) 1991, 2006  . Lumbago   . Rheumatoid arthritis     [facility]-Dr. Sigurd Sos  . Anxiety   . Depression   . Osteoporosis   . HTN (hypertension)   . Recurrent UTI     specialist at [facility]  . Migraine   . Carpal tunnel syndrome   . Myalgia and myositis, unspecified   . Fibromyalgia   . Arthritis     rheumatoid  . PONV (postoperative nausea and vomiting)   . Sinusitis    Past Surgical History  Procedure Laterality Date  . Back surgery      lower  . Shoulder surgery      left  . Rotator cuff repair    . Tonsillectomy    . Nasal sinus surgery      x3  . Tubal ligation    . Partial hysterectomy    . Colonoscopy  2003    Dr. Rehman--> ischemic colitis  . Colonoscopy with propofol  06/04/2012    [facility]:A sessile polyp measuring 3 mm   . Esophagogastroduodenoscopy (egd) with propofol  06/04/2012    [id number] ring was found at the gastroesophageal junction/SIGMOID ESOPHAGUS(DISTAL)/Non-erosive gastritis (inflammation)  . Savory dilation  06/04/2012    Procedure: SAVORY DILATION;  Surgeon: West Bali, MD;  Location: [facility];  Service: Endoscopy;  Laterality: N/A;  12mm, 12.8 mm, 14 mm  . Polypectomy  06/04/2012    Procedure: POLYPECTOMY;  Surgeon: West Bali, MD;   Location: [facility];  Service: Endoscopy;  Laterality: N/A;  . Esophagogastroduodenoscopy (egd) with esophageal dilation  06/17/2012    VZD:[id number] ring was found at the gastroesophageal junction/Non-erosive gastritis (inflammation)    Allergies  Allergen Reactions  . Compazine (Prochlorperazine Edisylate) Other (See Comments)    "pretzel effect"  . Sulfa Antibiotics Nausea And Vomiting    Current Outpatient Prescriptions  Medication Sig Dispense Refill  . adalimumab (HUMIRA) 40 MG/0.8ML injection Inject 40 mg into the skin every 14 (fourteen) days.      Marland Kitchen alendronate (FOSAMAX) 70 MG tablet Take 70 mg by mouth every 7 (seven) days. On Fridays      . ALPRAZolam (XANAX) 0.5 MG tablet Take 0.5 mg by mouth at bedtime as needed. Hypertension.      Marland Kitchen amLODipine (NORVASC) 10 MG tablet Take 10 mg by mouth daily.       Marland Kitchen atenolol (TENORMIN) 50 MG tablet Take 25 mg by mouth daily.      . clobetasol cream (TEMOVATE) 0.05 % Apply 1 application topically 2 (two) times daily.       . CYMBALTA 60 MG capsule Take 60 mg by mouth daily.       Marland Kitchen HYDROcodone-acetaminophen (LORTAB) 10-500 MG  per tablet Take 1 tablet by mouth every 6 (six) hours as needed. Pain.  1 TO 3 IN A DAY    . hydroxychloroquine (PLAQUENIL) 200 MG tablet Take 400 mg by mouth daily.       . pantoprazole (PROTONIX) 40 MG tablet Take 40 mg by mouth daily.       . predniSONE (DELTASONE) 5 MG tablet Take 5 mg by mouth daily.      Marland Kitchen PROLIA 60 MG/ML SOLN injection Inject 60 mg into the skin every 6 (six) months.       Marland Kitchen tiZANidine (ZANAFLEX) 4 MG tablet Take 4-8 mg by mouth at bedtime.        Review of Systems     Objective:   Physical Exam  Vitals reviewed. Constitutional: She is oriented to person, place, and time. She appears well-nourished. No distress.  HENT:  Head: Normocephalic and atraumatic.  Mouth/Throat: Oropharynx is clear and moist. No oropharyngeal exudate.  Eyes: Pupils are equal, round, and reactive to light. No scleral  icterus.  Neck: Normal range of motion. Neck supple.  BUFFALO HUMP  Cardiovascular: Normal rate, regular rhythm and normal heart sounds.   Pulmonary/Chest: Effort normal and breath sounds normal. No respiratory distress.  Abdominal: Soft. Bowel sounds are normal. She exhibits no distension. There is tenderness. There is no rebound and no guarding.  MILD TTP IN BLQs AND RUQ   Musculoskeletal: She exhibits no edema.  Lymphadenopathy:    She has no cervical adenopathy.  Neurological: She is alert and oriented to person, place, and time.  WALKS ASSISTED WITH A CANE. NO  NEW FOCAL DEFICITS   Psychiatric: She has a normal mood and affect.          Assessment & Plan:

## 2012-10-24 NOTE — Patient Instructions (Signed)
CONTINUE PROTONIX. INCREASE DOSE TO 30 MINUTES PRIOR TO MEALS TWICE DAILY.  ADD LINZESS 145 MCG DAILY. IT CAN CAUSE DIARRHEA.  USE DULCOLAX AS NEEDED FOR A GOOD BM.  FOLLOW A HIGH FIBER/LOW FAT DIET. AVOID ITEMS THAT CAUSE BLOATING AND GAS. SEE INFO BELOW.  DRINK WATER TO KEEP URINE LIGHT YELLOW.  FOLLOW UP IN AUG 2014.  High-Fiber Diet A high-fiber diet changes your normal diet to include more whole grains, legumes, fruits, and vegetables. Changes in the diet involve replacing refined carbohydrates with unrefined foods. The calorie level of the diet is essentially unchanged. The Dietary Reference Intake (recommended amount) for adult males is 38 grams per day. For adult females, it is 25 grams per day. Pregnant and lactating women should consume 28 grams of fiber per day. Fiber is the intact part of a plant that is not broken down during digestion. Functional fiber is fiber that has been isolated from the plant to provide a beneficial effect in the body. PURPOSE  Increase stool bulk.   Ease and regulate bowel movements.   Lower cholesterol.  INDICATIONS THAT YOU NEED MORE FIBER  Constipation and hemorrhoids.   Uncomplicated diverticulosis (intestine condition) and irritable bowel syndrome.   Weight management.   As a protective measure against hardening of the arteries (atherosclerosis), diabetes, and cancer.   DO NOT USE WITH:  Acute diverticulitis (intestine infection).   Partial small bowel obstructions.   Complicated diverticular disease involving bleeding, rupture (perforation), or abscess (boil, furuncle).   Presence of autonomic neuropathy (nerve damage) or gastroparesis (stomach cannot empty itself).    GUIDELINES FOR INCREASING FIBER IN THE DIET  Start adding fiber to the diet slowly. A gradual increase of about 5 more grams (2 slices of whole-wheat bread, 2 servings of most fruits or vegetables, or 1 bowl of high-fiber cereal) per day is best. Too rapid an  increase in fiber may result in constipation, flatulence, and bloating.   Drink enough water and fluids to keep your urine clear or pale yellow. Water, juice, or caffeine-free drinks are recommended. Not drinking enough fluid may cause constipation.   Eat a variety of high-fiber foods rather than one type of fiber.   Try to increase your intake of fiber through using high-fiber foods rather than fiber pills or supplements that contain small amounts of fiber.   The goal is to change the types of food eaten. Do not supplement your present diet with high-fiber foods, but replace foods in your present diet.    INCLUDE A VARIETY OF FIBER SOURCES  Replace refined and processed grains with whole grains, canned fruits with fresh fruits, and incorporate other fiber sources. White rice, white breads, and most bakery goods contain little or no fiber.   Brown whole-grain rice, buckwheat oats, and many fruits and vegetables are all good sources of fiber. These include: broccoli, Brussels sprouts, cabbage, cauliflower, beets, sweet potatoes, white potatoes (skin on), carrots, tomatoes, eggplant, squash, berries, fresh fruits, and dried fruits.   Cereals appear to be the richest source of fiber. Cereal fiber is found in whole grains and bran. Bran is the fiber-rich outer coat of cereal grain, which is largely removed in refining. In whole-grain cereals, the bran remains. In breakfast cereals, the largest amount of fiber is found in those with "bran" in their names. The fiber content is sometimes indicated on the label.   You may need to include additional fruits and vegetables each day.   In baking, for 1 cup white flour,  you may use the following substitutions:   1 cup whole-wheat flour minus 2 tablespoons.   1/2 cup white flour plus 1/2 cup whole-wheat flour.   Low-Fat Diet BREADS, CEREALS, PASTA, RICE, DRIED PEAS, AND BEANS These products are high in carbohydrates and most are low in fat. Therefore,  they can be increased in the diet as substitutes for fatty foods. They too, however, contain calories and should not be eaten in excess. Cereals can be eaten for snacks as well as for breakfast.   FRUITS AND VEGETABLES It is good to eat fruits and vegetables. Besides being sources of fiber, both are rich in vitamins and some minerals. They help you get the daily allowances of these nutrients. Fruits and vegetables can be used for snacks and desserts.  MEATS Limit lean meat, chicken, [place], and fish to no more than 6 ounces per day. Beef, Pork, and Lamb Use lean cuts of beef, pork, and lamb. Lean cuts include:  Extra-lean ground beef.  Arm roast.  Sirloin tip.  Center-cut ham.  Round steak.  Loin chops.  Rump roast.  Tenderloin.  Trim all fat off the outside of meats before cooking. It is not necessary to severely decrease the intake of red meat, but lean choices should be made. Lean meat is rich in protein and contains a highly absorbable form of iron. Premenopausal women, in particular, should avoid reducing lean red meat because this could increase the risk for low red blood cells (iron-deficiency anemia).  Chicken and [place] These are good sources of protein. The fat of poultry can be reduced by removing the skin and underlying fat layers before cooking. Chicken and [place] can be substituted for lean red meat in the diet. Poultry should not be fried or covered with high-fat sauces. Fish and Shellfish Fish is a good source of protein. Shellfish contain cholesterol, but they usually are low in saturated fatty acids. The preparation of fish is important. Like chicken and [place], they should not be fried or covered with high-fat sauces. EGGS Egg whites contain no fat or cholesterol. They can be eaten often. Try 1 to 2 egg whites instead of whole eggs in recipes or use egg substitutes that do not contain yolk. MILK AND DAIRY PRODUCTS Use skim or 1% milk instead of 2% or whole milk. Decrease  whole milk, natural, and processed cheeses. Use nonfat or low-fat (2%) cottage cheese or low-fat cheeses made from vegetable oils. Choose nonfat or low-fat (1 to 2%) yogurt. Experiment with evaporated skim milk in recipes that call for heavy cream. Substitute low-fat yogurt or low-fat cottage cheese for sour cream in dips and salad dressings. Have at least 2 servings of low-fat dairy products, such as 2 glasses of skim (or 1%) milk each day to help get your daily calcium intake. FATS AND OILS Reduce the total intake of fats, especially saturated fat. Butterfat, lard, and beef fats are high in saturated fat and cholesterol. These should be avoided as much as possible. Vegetable fats do not contain cholesterol, but certain vegetable fats, such as coconut oil, palm oil, and palm kernel oil are very high in saturated fats. These should be limited. These fats are often used in bakery goods, processed foods, popcorn, oils, and nondairy creamers. Vegetable shortenings and some peanut butters contain hydrogenated oils, which are also saturated fats. Read the labels on these foods and check for saturated vegetable oils. Unsaturated vegetable oils and fats do not raise blood cholesterol. However, they should be limited because they  are fats and are high in calories. Total fat should still be limited to 30% of your daily caloric intake. Desirable liquid vegetable oils are corn oil, cottonseed oil, olive oil, canola oil, safflower oil, soybean oil, and sunflower oil. Peanut oil is not as good, but small amounts are acceptable. Buy a heart-healthy tub margarine that has no partially hydrogenated oils in the ingredients. Mayonnaise and salad dressings often are made from unsaturated fats, but they should also be limited because of their high calorie and fat content. Seeds, nuts, peanut butter, olives, and avocados are high in fat, but the fat is mainly the unsaturated type. These foods should be limited mainly to avoid excess  calories and fat. OTHER EATING TIPS Snacks  Most sweets should be limited as snacks. They tend to be rich in calories and fats, and their caloric content outweighs their nutritional value. Some good choices in snacks are graham crackers, melba toast, soda crackers, bagels (no egg), English muffins, fruits, and vegetables. These snacks are preferable to snack crackers, [place] fries, TORTILLA CHIPS, and POTATO chips. Popcorn should be air-popped or cooked in small amounts of liquid vegetable oil. Desserts Eat fruit, low-fat yogurt, and fruit ices instead of pastries, cake, and cookies. Sherbet, angel food cake, gelatin dessert, frozen low-fat yogurt, or other frozen products that do not contain saturated fat (pure fruit juice bars, frozen ice pops) are also acceptable.  COOKING METHODS Choose those methods that use little or no fat. They include: Poaching.  Braising.  Steaming.  Grilling.  Baking.  Stir-frying.  Broiling.  Microwaving.  Foods can be cooked in a nonstick pan without added fat, or use a nonfat cooking spray in regular cookware. Limit fried foods and avoid frying in saturated fat. Add moisture to lean meats by using water, broth, cooking wines, and other nonfat or low-fat sauces along with the cooking methods mentioned above. Soups and stews should be chilled after cooking. The fat that forms on top after a few hours in the refrigerator should be skimmed off. When preparing meals, avoid using excess salt. Salt can contribute to raising blood pressure in some people.  EATING AWAY FROM HOME Order entres, potatoes, and vegetables without sauces or butter. When meat exceeds the size of a deck of cards (3 to 4 ounces), the rest can be taken home for another meal. Choose vegetable or fruit salads and ask for low-calorie salad dressings to be served on the side. Use dressings sparingly. Limit high-fat toppings, such as bacon, crumbled eggs, cheese, sunflower seeds, and olives. Ask for  heart-healthy tub margarine instead of butter.

## 2012-10-25 NOTE — Progress Notes (Signed)
Reminder in epic °

## 2012-10-29 ENCOUNTER — Telehealth: Payer: Self-pay

## 2012-10-29 MED ORDER — LACTULOSE 10 GM/15ML PO SOLN: ORAL | Status: DC

## 2012-10-29 NOTE — Telephone Encounter (Signed)
PLEASE CALL PT. Rx sent. LET HER KNOW LACTULOSE MAY CAUSE BLOATING, AND GAS. DRINK WATER TO KEEP YOUR URINE LIGHT YELLOW. FOLLOW A HIGH IFBER DIET. AVOID ITEMS THAT CAUSE BLOATING & GAS. CALL WITH QUESTIONS OR CONCERNS.

## 2012-10-29 NOTE — Telephone Encounter (Signed)
Received prior authorization request from pts pharmacy. Linzess is not on pts formulary. miralax and lactulose are the only constipation meds on her formulary. I called pt, she has tried miralax but has never tried lactulose. Tried to do PA for linzess but it came back denied because pt has never tried lactulose. Please advise.

## 2012-10-29 NOTE — Telephone Encounter (Signed)
Called and informed pt.  

## 2013-02-27 ENCOUNTER — Telehealth: Payer: Self-pay | Admitting: Gastroenterology

## 2013-02-27 ENCOUNTER — Encounter: Payer: Self-pay | Admitting: Gastroenterology

## 2013-02-27 DIAGNOSIS — R748 Abnormal levels of other serum enzymes: Secondary | ICD-10-CM

## 2013-02-27 HISTORY — DX: Abnormal levels of other serum enzymes: R74.8

## 2013-02-27 NOTE — Assessment & Plan Note (Signed)
SEROLOGIES AS AN OUTPT. CONSIDER LIVER BIOPSY IF NEGATIVE

## 2013-02-27 NOTE — Telephone Encounter (Addendum)
PLEASE CALL PT. Sharon Spencer CONTACTED [place] BECAUSE HER LIVER ENZYMES ARE ELEVATED. HER ABDOMINAL U/S IS NL. SHE NEEDS TO HAVE HER BLOOD DRAWN TO CHECK FOR AUTOIMMUNE DISEASES OF THE LIVER. OPV IN 6 WEEKS E30 ELEVATED LIVER ENZYMES

## 2013-02-27 NOTE — Telephone Encounter (Signed)
CALLED BY DR. KATE MITCHELL-RHEUMATOLOGY. REVIEWED LABS FAXED FROM AUG 2014.

## 2013-02-28 ENCOUNTER — Encounter: Payer: Self-pay | Admitting: Gastroenterology

## 2013-02-28 NOTE — Telephone Encounter (Signed)
LMOM to call. Lab order has been faxed

## 2013-02-28 NOTE — Telephone Encounter (Signed)
Pt called back and was in formed

## 2013-02-28 NOTE — Telephone Encounter (Signed)
Pt is aware of OV on 9/24 at 9 with SF and appt card was mailed

## 2013-03-05 LAB — IGG, IGA, IGM: IgG (Immunoglobin G), Serum: 1450 mg/dL (ref 690–1700)

## 2013-03-05 LAB — MITOCHONDRIAL/SMOOTH MUSCLE AB PNL: Mitochondrial M2 Ab, IgG: 0.48 (ref <0.91)

## 2013-03-10 NOTE — Telephone Encounter (Addendum)
Called patient TO DISCUSS CONCERNS. LVM-CALL [phone] OR [phone] TO DISCUSS.  HER LIVER SEROLOGIES/TESTS ARE NORMAL. SHE NEEDS A LIVER BIOPSY. OPV IN Apr 09 2013. LOST ~20 LBS SINCE 2011. HFP NL 2011.  EXPLAINED LIVER BIOPSY, BENEFITS, RISKS, AND COMPLICATIONS. PT AGREEABLE TO HAVE PROCEDURE. SCHEDULE AFTER LABOR DAY ON A MON, TUE,OR WED.

## 2013-03-11 ENCOUNTER — Encounter: Payer: Self-pay | Admitting: Gastroenterology

## 2013-03-11 ENCOUNTER — Other Ambulatory Visit: Payer: Self-pay | Admitting: Gastroenterology

## 2013-03-11 DIAGNOSIS — R748 Abnormal levels of other serum enzymes: Secondary | ICD-10-CM

## 2013-03-11 NOTE — Telephone Encounter (Signed)
ERROR

## 2013-03-11 NOTE — Telephone Encounter (Signed)
I have sent order for Liver Biopsy, they will contact Mrs. Bently to schedule date & time

## 2013-03-14 ENCOUNTER — Other Ambulatory Visit: Payer: Self-pay | Admitting: Radiology

## 2013-03-14 ENCOUNTER — Encounter (HOSPITAL_COMMUNITY): Payer: Self-pay | Admitting: Pharmacy Technician

## 2013-03-19 ENCOUNTER — Ambulatory Visit (HOSPITAL_COMMUNITY)
Admission: RE | Admit: 2013-03-19 | Discharge: 2013-03-19 | Disposition: A | Discharge status: Home / Self Care | Payer: Medicare Other | Source: Ambulatory Visit | Attending: Gastroenterology | Admitting: Gastroenterology

## 2013-03-19 ENCOUNTER — Encounter (HOSPITAL_COMMUNITY): Payer: Self-pay

## 2013-03-19 DIAGNOSIS — K7689 Other specified diseases of liver: Secondary | ICD-10-CM | POA: Insufficient documentation

## 2013-03-19 DIAGNOSIS — K759 Inflammatory liver disease, unspecified: Secondary | ICD-10-CM | POA: Insufficient documentation

## 2013-03-19 DIAGNOSIS — R748 Abnormal levels of other serum enzymes: Secondary | ICD-10-CM

## 2013-03-19 LAB — CBC
Hemoglobin: 15 g/dL (ref 12.0–15.0)
MCH: 30.4 pg (ref 26.0–34.0)
RBC: 4.94 MIL/uL (ref 3.87–5.11)

## 2013-03-19 LAB — PROTIME-INR
INR: 1.04 (ref 0.00–1.49)
Prothrombin Time: 13.4 seconds (ref 11.6–15.2)

## 2013-03-19 MED ORDER — FENTANYL CITRATE 0.05 MG/ML IJ SOLN
INTRAMUSCULAR | Status: AC
  Filled 2013-03-19: qty 4

## 2013-03-19 MED ORDER — FENTANYL CITRATE 0.05 MG/ML IJ SOLN
INTRAMUSCULAR | Status: DC | PRN
  Administered 2013-03-19: 50 ug via INTRAVENOUS

## 2013-03-19 MED ORDER — MIDAZOLAM HCL 2 MG/2ML IJ SOLN
INTRAMUSCULAR | Status: DC | PRN
  Administered 2013-03-19: 1 mg via INTRAVENOUS

## 2013-03-19 MED ORDER — MIDAZOLAM HCL 2 MG/2ML IJ SOLN
INTRAMUSCULAR | Status: AC | PRN
  Administered 2013-03-19: 1 mg via INTRAVENOUS

## 2013-03-19 MED ORDER — MIDAZOLAM HCL 2 MG/2ML IJ SOLN
INTRAMUSCULAR | Status: AC
  Filled 2013-03-19: qty 4

## 2013-03-19 MED ORDER — FENTANYL CITRATE 0.05 MG/ML IJ SOLN
INTRAMUSCULAR | Status: AC | PRN
  Administered 2013-03-19: 50 ug via INTRAVENOUS

## 2013-03-19 MED ORDER — SODIUM CHLORIDE 0.9 % IV SOLN
INTRAVENOUS | Status: DC
  Administered 2013-03-19: 09:00:00 via INTRAVENOUS

## 2013-03-19 MED ORDER — HYDROCODONE-ACETAMINOPHEN 5-325 MG PO TABS: 1.0000 | ORAL_TABLET | ORAL | Status: DC | PRN

## 2013-03-19 NOTE — Procedures (Signed)
Successful US guided random liver core biopsy, 33mm, 18g X 2 No complications. Tolerated well.  Brayton El PA-C Interventional Radiology 03/19/2013 11:00 AM

## 2013-03-19 NOTE — H&P (Signed)
Sharon Spencer is an 62 y.o. female.   Chief Complaint: "fatty liver" dx yrs ago Has been followed by primary MD Rheumatoid arthritis MD at [facility] also follows pts blood work Recent elevation in liver functions Referred to Dr Jonette Eva - GI MD in [place], [place] Request for random liver biopsy HPI: TIA; Rh arthritis; HTN; fibromyalgia; migraines; elevated LFTs  Past Medical History  Diagnosis Date  . TIA (transient ischemic attack) 1991, 2006  . Lumbago   . Rheumatoid arthritis(714.0)     sepcialist at [facility], Dr. Sigurd Sos  . Anxiety   . Depression   . Osteoporosis   . HTN (hypertension)   . Recurrent UTI     specialist at [facility]  . Migraine   . Carpal tunnel syndrome   . Myalgia and myositis, unspecified   . Fibromyalgia   . Arthritis     rheumatoid  . PONV (postoperative nausea and vomiting)   . Sinusitis   . Elevated liver enzymes 02/27/2013    AUG 2014 GG 470 ALT 41 AST 32 ALK PHOS 275     Past Surgical History  Procedure Laterality Date  . Back surgery      lower  . Shoulder surgery      left  . Rotator cuff repair    . Tonsillectomy    . Nasal sinus surgery      x3  . Tubal ligation    . Partial hysterectomy    . Colonoscopy  2003    Dr. Rehman--> ischemic colitis  . Colonoscopy with propofol  06/04/2012    SLF:A sessile polyp measuring 3 mm   . Esophagogastroduodenoscopy (egd) with propofol  06/04/2012    JYN:WGNFAOZH ring was found at the gastroesophageal junction/SIGMOID ESOPHAGUS(DISTAL)/Non-erosive gastritis (inflammation)  . Savory dilation  06/04/2012    Procedure: SAVORY DILATION;  Surgeon: West Bali, MD;  Location: [facility];  Service: Endoscopy;  Laterality: N/A;  12mm, 12.8 mm, 14 mm  . Polypectomy  06/04/2012    Procedure: POLYPECTOMY;  Surgeon: West Bali, MD;  Location: [facility];  Service: Endoscopy;  Laterality: N/A;  . Esophagogastroduodenoscopy (egd) with esophageal dilation  06/17/2012    YQM:VHQIONGE ring was found at the  gastroesophageal junction/Non-erosive gastritis (inflammation)    Family History  Problem Relation Age of Onset  . Colon cancer Neg Hx   . Diabetes Father   . Kidney failure Father   . Breast cancer Mother   . Stroke Mother   . Diabetes      siblings  . Lymphoma Sister   . Liver disease      history of elevated LFTs in sister too   Social History:  reports that she has never smoked. She does not have any smokeless tobacco history on file. She reports that she does not drink alcohol or use illicit drugs.  Allergies:  Allergies  Allergen Reactions  . Compazine [Prochlorperazine Edisylate] Other (See Comments)    "pretzel effect"  . Sulfa Antibiotics Nausea And Vomiting     (Not in a hospital admission)  Results for orders placed during the hospital encounter of 03/19/13 (from the past 48 hour(s))  APTT     Status: None   Collection Time    03/19/13  8:37 AM      Result Value Range   aPTT 28  24 - 37 seconds  CBC     Status: None   Collection Time    03/19/13  8:37 AM  Result Value Range   WBC 10.0  4.0 - 10.5 K/uL   RBC 4.94  3.87 - 5.11 MIL/uL   Hemoglobin 15.0  12.0 - 15.0 g/dL   HCT 13.0  86.5 - 78.4 %   MCV 87.2  78.0 - 100.0 fL   MCH 30.4  26.0 - 34.0 pg   MCHC 34.8  30.0 - 36.0 g/dL   RDW 69.6  29.5 - 28.4 %   Platelets 297  150 - 400 K/uL  PROTIME-INR     Status: None   Collection Time    03/19/13  8:37 AM      Result Value Range   Prothrombin Time 13.4  11.6 - 15.2 seconds   INR 1.04  0.00 - 1.49   No results found.  Review of Systems  Constitutional: Positive for weight loss. Negative for fever.  Respiratory: Negative for cough and shortness of breath.   Cardiovascular: Negative for chest pain.  Gastrointestinal: Negative for nausea, vomiting and abdominal pain.  Musculoskeletal: Positive for myalgias.       Rheumatoid arthritis  Neurological: Positive for weakness. Negative for headaches.    Blood pressure 154/56, pulse 68, temperature  97.3 F (36.3 C), temperature source Oral, resp. rate 18, height 5\' 1"  (1.549 m), weight 159 lb (72.122 kg), SpO2 95.00%. Physical Exam  Constitutional: She is oriented to person, place, and time.  Cardiovascular: Normal rate, regular rhythm and normal heart sounds.   No murmur heard. Respiratory: Effort normal and breath sounds normal. She has no wheezes.  GI: Soft. Bowel sounds are normal. There is no tenderness.  Musculoskeletal: Normal range of motion.  jt pain- Rh arthritis Uses cane  Neurological: She is alert and oriented to person, place, and time.  Skin: Skin is warm and dry.  Psychiatric: She has a normal mood and affect. Her behavior is normal. Judgment and thought content normal.     Assessment/Plan Recent elevation in liver functions Hx "fatty liver" Scheduled for random liver bx in [place] Pt aware of procedure benefits and risks and agreeable to proceed Consent signed and in chart  Arihana Ambrocio A 03/19/2013, 9:44 AM

## 2013-03-26 ENCOUNTER — Telehealth: Payer: Self-pay

## 2013-03-26 NOTE — Telephone Encounter (Addendum)
DISCUSSED RESULTS WITH PT. ELEVATED LIVER ENZYMES MOST LIKELY DUE TO MEDS (PLAQUENIL/HUMIRA) AND LESS LIKELY WEIGHT LOSS/STEATOSIS. PT HAD NL LIVER ENZYMES IN 2011. DISCUSSED WITH PT SHE WILL NEED TO CONSIDER THE BENEFITS V. RISKS OF THE MEDS. ENCOURAGED CONTINUED WEIGHT LOSS. CALLED DR. MITCHELL TO DISCUSS. SHE'S NOT IN THE [facility] TODAY. LEFT MESSAGE. DR. Clovis Riley MAY CALL MY CELL TO DISCUSS.

## 2013-03-26 NOTE — Telephone Encounter (Signed)
SEE TC AUG 14

## 2013-03-26 NOTE — Telephone Encounter (Signed)
Pt called for results of liver biopsy done on 03/19/2013.

## 2013-04-09 ENCOUNTER — Encounter: Payer: Self-pay | Admitting: Gastroenterology

## 2013-04-09 ENCOUNTER — Ambulatory Visit (INDEPENDENT_AMBULATORY_CARE_PROVIDER_SITE_OTHER): Payer: Medicare Other | Admitting: Gastroenterology

## 2013-04-09 VITALS — BP 114/76 | HR 61 | Temp 98.0°F | Ht 61.0 in | Wt 149.8 lb

## 2013-04-09 DIAGNOSIS — R1310 Dysphagia, unspecified: Secondary | ICD-10-CM

## 2013-04-09 DIAGNOSIS — K219 Gastro-esophageal reflux disease without esophagitis: Secondary | ICD-10-CM

## 2013-04-09 DIAGNOSIS — K5900 Constipation, unspecified: Secondary | ICD-10-CM

## 2013-04-09 DIAGNOSIS — R748 Abnormal levels of other serum enzymes: Secondary | ICD-10-CM

## 2013-04-09 MED ORDER — LINACLOTIDE 145 MCG PO CAPS: ORAL_CAPSULE | ORAL | Status: DC

## 2013-04-09 NOTE — Assessment & Plan Note (Signed)
SX EXACERBATED BY CERTAIN FOODS.  CONTINUE PROTONIX. TAKE 30 MINUTES PRIOR TO MEALS TWICE DAILY. LOW FAT DIET OPV IN 3 MOS.

## 2013-04-09 NOTE — Assessment & Plan Note (Signed)
MILD SX  NO ADDITIONAL WORKUP NEEDED AT THIS TIME.

## 2013-04-09 NOTE — Assessment & Plan Note (Addendum)
SX NOT IDEALLY CONTROLLED WITH LACTULOSE OR MIRALAX. LACTULOSE CAUSED NAUSEA AND BLOATING.   ADD LINZESS 30 MINS PRIOR TO BREAKFAST.  DRINK WATER TO KEEP YOUR URINE LIGHT YELLOW. FOLLOW A HIGH FIBER/LOW FAT DIET.  HO GIVEN. FOLLOW UP IN 3 MOS.

## 2013-04-09 NOTE — Progress Notes (Signed)
Subjective:    Patient ID: Sharon Spencer, female    DOB: 26-May-1951, 62 y.o.   MRN: [id number]  Fredirick Maudlin, MD  HPI WHEN SHE WAS A KID USED MINERAL OIL. Still having problems with digestive tract. Liquid caused nausea/BLOATING/GAS, BUT DID HAVE A BM. AWAITING W/U FOR ELEVATED LIVER ENZYMES. DIFFICULTY PASSING STOOL. LAST BM: THUR. DECREASED APPETITE-HAVING TOOTH PROBLEMS. HAVING PROBLEM WITH L SINUS/JAW.TOOTH. T RUNS AROUND 100F. HAVING DARK STOOL BUT NOT BLACK. VOMITS DEPENDS ON HOW BAD HER CONSTIPAION IS. MILD ABD PAIN IN THE MIDDLE. RARE DIARRHEA. SWALLOWING IS BETTER. Heartburn or indigestion MAY BE TRIGGERED BY CERTAIN FOODS. PT DENIES BRBPR, OR melena.  Past Medical History  Diagnosis Date  . TIA (transient ischemic attack) 1991, 2006  . Lumbago   . Rheumatoid arthritis(714.0)     sepcialist at [facility], Dr. Sigurd Sos  . Anxiety   . Depression   . Osteoporosis   . HTN (hypertension)   . Recurrent UTI     specialist at [facility]  . Migraine   . Carpal tunnel syndrome   . Myalgia and myositis, unspecified   . Fibromyalgia   . Arthritis     rheumatoid  . PONV (postoperative nausea and vomiting)   . Sinusitis   . Elevated liver enzymes 02/27/2013    AUG 2014 GG 470 ALT 41 AST 32 ALK PHOS 275    Past Surgical History  Procedure Laterality Date  . Back surgery      lower  . Shoulder surgery      left  . Rotator cuff repair    . Tonsillectomy    . Nasal sinus surgery      x3  . Tubal ligation    . Partial hysterectomy    . Colonoscopy  2003    Dr. Rehman--> ischemic colitis  . Colonoscopy with propofol  06/04/2012    SLF:A sessile polyp measuring 3 mm   . Esophagogastroduodenoscopy (egd) with propofol  06/04/2012    JYN:WGNFAOZH ring was found at the gastroesophageal junction/SIGMOID ESOPHAGUS(DISTAL)/Non-erosive gastritis (inflammation)  . Savory dilation  06/04/2012    Procedure: SAVORY DILATION;  Surgeon: West Bali, MD;  Location: [facility];  Service:  Endoscopy;  Laterality: N/A;  12mm, 12.8 mm, 14 mm  . Polypectomy  06/04/2012    Procedure: POLYPECTOMY;  Surgeon: West Bali, MD;  Location: [facility];  Service: Endoscopy;  Laterality: N/A;  . Esophagogastroduodenoscopy (egd) with esophageal dilation  06/17/2012    YQM:VHQIONGE ring was found at the gastroesophageal junction/Non-erosive gastritis (inflammation)    Allergies  Allergen Reactions  . Compazine [Prochlorperazine Edisylate] Other (See Comments)    "pretzel effect"  . Sulfa Antibiotics Nausea And Vomiting    Current Outpatient Prescriptions  Medication Sig Dispense Refill  . adalimumab (HUMIRA) 40 MG/0.8ML injection Inject 40 mg into the skin every 14 (fourteen) days.      Marland Kitchen alendronate (FOSAMAX) 70 MG tablet Take 70 mg by mouth every 7 (seven) days. Take with a full glass of water on an empty stomach.      . ALPRAZolam (XANAX) 0.5 MG tablet Take 0.5 mg by mouth at bedtime as needed. Hypertension.      Marland Kitchen amLODipine (NORVASC) 10 MG tablet Take 10 mg by mouth daily.       Marland Kitchen atenolol (TENORMIN) 50 MG tablet Take 50 mg by mouth daily.       . clobetasol cream (TEMOVATE) 0.05 % Apply 1 application topically 2 (two) times daily.       Marland Kitchen  CYMBALTA 60 MG capsule Take 60 mg by mouth daily.       . hydroxychloroquine (PLAQUENIL) 200 MG tablet Take 400 mg by mouth daily.       Marland Kitchen lactulose (CHRONULAC) 10 GM/15ML solution Take 10 g by mouth 2 (two) times daily.      Marland Kitchen oxycodone (OXY-IR) 5 MG capsule Take 5 mg by mouth every 6 (six) hours as needed for pain.      . pantoprazole (PROTONIX) 40 MG tablet Take 40 mg by mouth 2 (two) times daily. 30 mins prior to breakfast and supper      . predniSONE (DELTASONE) 5 MG tablet Take 5 mg by mouth daily.      Marland Kitchen PROLIA 60 MG/ML SOLN injection Inject 60 mg into the skin every 6 (six) months.       Marland Kitchen tiZANidine (ZANAFLEX) 4 MG tablet Take 4-8 mg by mouth daily as needed.       . zolpidem (AMBIEN) 5 MG tablet Take 5 mg by mouth at bedtime as needed for  sleep.           Review of Systems     Objective:   Physical Exam  Vitals reviewed. Constitutional: She is oriented to person, place, and time. She appears well-nourished. No distress.  HENT:  Head: Normocephalic and atraumatic.  Mouth/Throat: Oropharynx is clear and moist. No oropharyngeal exudate.  Eyes: Pupils are equal, round, and reactive to light. No scleral icterus.  Neck: Normal range of motion. Neck supple.  Cardiovascular: Normal rate, regular rhythm and normal heart sounds.   Pulmonary/Chest: Effort normal and breath sounds normal. No respiratory distress.  Abdominal: Soft. Bowel sounds are normal. She exhibits no distension. There is tenderness. There is no rebound and no guarding.  MILD TTP IN RLQ/PERIUMBILICAL REGION  Musculoskeletal: She exhibits no edema.  Lymphadenopathy:    She has no cervical adenopathy.  Neurological: She is alert and oriented to person, place, and time.  NO FOCAL DEFICITS   Psychiatric: She has a normal mood and affect.          Assessment & Plan:

## 2013-04-09 NOTE — Assessment & Plan Note (Signed)
MOST LIKELY DRUG INDUCED.  AWAITING MEDICATION ADJUSTMENT BY DR. Clovis Riley. NO ADDITIONAL WORKUP NEEDED AT THIS TIME. OPV IN 3 MOS

## 2013-04-09 NOTE — Progress Notes (Signed)
Reminder in epic °

## 2013-04-09 NOTE — Progress Notes (Signed)
CC'd to PCP 

## 2013-04-09 NOTE — Patient Instructions (Addendum)
ADD LINZESS 30 MINS PRIOR TO BREAKFAST.   DRINK WATER TO KEEP YOUR URINE LIGHT YELLOW.  FOLLOW A HIGH FIBER/LOW FAT DIET. SEE INFO BELOW.  FOLLOW UP IN 3 MOS.  Low-Fat Diet BREADS, CEREALS, PASTA, RICE, DRIED PEAS, AND BEANS These products are high in carbohydrates and most are low in fat. Therefore, they can be increased in the diet as substitutes for fatty foods. They too, however, contain calories and should not be eaten in excess. Cereals can be eaten for snacks as well as for breakfast.  Include foods that contain fiber (fruits, vegetables, whole grains, and legumes). Research shows that fiber may lower blood cholesterol levels, especially the water-soluble fiber found in fruits, vegetables, oat products, and legumes. FRUITS AND VEGETABLES It is good to eat fruits and vegetables. Besides being sources of fiber, both are rich in vitamins and some minerals. They help you get the daily allowances of these nutrients. Fruits and vegetables can be used for snacks and desserts. MEATS Limit lean meat, chicken, [place], and fish to no more than 6 ounces per day. Beef, Pork, and Lamb Use lean cuts of beef, pork, and lamb. Lean cuts include:  Extra-lean ground beef.  Arm roast.  Sirloin tip.  Center-cut ham.  Round steak.  Loin chops.  Rump roast.  Tenderloin.  Trim all fat off the outside of meats before cooking. It is not necessary to severely decrease the intake of red meat, but lean choices should be made. Lean meat is rich in protein and contains a highly absorbable form of iron. Premenopausal women, in particular, should avoid reducing lean red meat because this could increase the risk for low red blood cells (iron-deficiency anemia). Processed Meats Processed meats, such as bacon, bologna, salami, sausage, and hot dogs contain large quantities of fat, are not rich in valuable nutrients, and should not be eaten very often. Organ Meats The organ meats, such as liver, sweetbreads, kidneys,  and brain are very rich in cholesterol. They should be limited. Chicken and [place] These are good sources of protein. The fat of poultry can be reduced by removing the skin and underlying fat layers before cooking. Chicken and [place] can be substituted for lean red meat in the diet. Poultry should not be fried or covered with high-fat sauces. Fish and Shellfish Fish is a good source of protein. Shellfish contain cholesterol, but they usually are low in saturated fatty acids. The preparation of fish is important. Like chicken and [place], they should not be fried or covered with high-fat sauces. EGGS Egg yolks often are hidden in cooked and processed foods. Egg whites contain no fat or cholesterol. They can be eaten often. Try 1 to 2 egg whites instead of whole eggs in recipes or use egg substitutes that do not contain yolk. MILK AND DAIRY PRODUCTS Use skim or 1% milk instead of 2% or whole milk. Decrease whole milk, natural, and processed cheeses. Use nonfat or low-fat (2%) cottage cheese or low-fat cheeses made from vegetable oils. Choose nonfat or low-fat (1 to 2%) yogurt. Experiment with evaporated skim milk in recipes that call for heavy cream. Substitute low-fat yogurt or low-fat cottage cheese for sour cream in dips and salad dressings. Have at least 2 servings of low-fat dairy products, such as 2 glasses of skim (or 1%) milk each day to help get your daily calcium intake. FATS AND OILS Reduce the total intake of fats, especially saturated fat. Butterfat, lard, and beef fats are high in saturated fat and cholesterol.  These should be avoided as much as possible. Vegetable fats do not contain cholesterol, but certain vegetable fats, such as coconut oil, palm oil, and palm kernel oil are very high in saturated fats. These should be limited. These fats are often used in bakery goods, processed foods, popcorn, oils, and nondairy creamers. Vegetable shortenings and some peanut butters contain hydrogenated  oils, which are also saturated fats. Read the labels on these foods and check for saturated vegetable oils. Unsaturated vegetable oils and fats do not raise blood cholesterol. However, they should be limited because they are fats and are high in calories. Total fat should still be limited to 30% of your daily caloric intake. Desirable liquid vegetable oils are corn oil, cottonseed oil, olive oil, canola oil, safflower oil, soybean oil, and sunflower oil. Peanut oil is not as good, but small amounts are acceptable. Buy a heart-healthy tub margarine that has no partially hydrogenated oils in the ingredients. Mayonnaise and salad dressings often are made from unsaturated fats, but they should also be limited because of their high calorie and fat content. Seeds, nuts, peanut butter, olives, and avocados are high in fat, but the fat is mainly the unsaturated type. These foods should be limited mainly to avoid excess calories and fat. OTHER EATING TIPS Snacks  Most sweets should be limited as snacks. They tend to be rich in calories and fats, and their caloric content outweighs their nutritional value. Some good choices in snacks are graham crackers, melba toast, soda crackers, bagels (no egg), English muffins, fruits, and vegetables. These snacks are preferable to snack crackers, [place] fries, and chips. Popcorn should be air-popped or cooked in small amounts of liquid vegetable oil. Desserts Eat fruit, low-fat yogurt, and fruit ices instead of pastries, cake, and cookies. Sherbet, angel food cake, gelatin dessert, frozen low-fat yogurt, or other frozen products that do not contain saturated fat (pure fruit juice bars, frozen ice pops) are also acceptable.  COOKING METHODS Choose those methods that use little or no fat. They include: Poaching.  Braising.  Steaming.  Grilling.  Baking.  Stir-frying.  Broiling.  Microwaving.  Foods can be cooked in a nonstick pan without added fat, or use a nonfat cooking  spray in regular cookware. Limit fried foods and avoid frying in saturated fat. Add moisture to lean meats by using water, broth, cooking wines, and other nonfat or low-fat sauces along with the cooking methods mentioned above. Soups and stews should be chilled after cooking. The fat that forms on top after a few hours in the refrigerator should be skimmed off. When preparing meals, avoid using excess salt. Salt can contribute to raising blood pressure in some people. EATING AWAY FROM HOME Order entres, potatoes, and vegetables without sauces or butter. When meat exceeds the size of a deck of cards (3 to 4 ounces), the rest can be taken home for another meal. Choose vegetable or fruit salads and ask for low-calorie salad dressings to be served on the side. Use dressings sparingly. Limit high-fat toppings, such as bacon, crumbled eggs, cheese, sunflower seeds, and olives. Ask for heart-healthy tub margarine instead of butter.  High-Fiber Diet A high-fiber diet changes your normal diet to include more whole grains, legumes, fruits, and vegetables. Changes in the diet involve replacing refined carbohydrates with unrefined foods. The calorie level of the diet is essentially unchanged. The Dietary Reference Intake (recommended amount) for adult males is 38 grams per day. For adult females, it is 25 grams per day. Pregnant and  lactating women should consume 28 grams of fiber per day. Fiber is the intact part of a plant that is not broken down during digestion. Functional fiber is fiber that has been isolated from the plant to provide a beneficial effect in the body. PURPOSE  Increase stool bulk.   Ease and regulate bowel movements.   Lower cholesterol.  INDICATIONS THAT YOU NEED MORE FIBER  Constipation and hemorrhoids.   Uncomplicated diverticulosis (intestine condition) and irritable bowel syndrome.   Weight management.   As a protective measure against hardening of the arteries  (atherosclerosis), diabetes, and cancer.   GUIDELINES FOR INCREASING FIBER IN THE DIET  Start adding fiber to the diet slowly. A gradual increase of about 5 more grams (2 slices of whole-wheat bread, 2 servings of most fruits or vegetables, or 1 bowl of high-fiber cereal) per day is best. Too rapid an increase in fiber may result in constipation, flatulence, and bloating.   Drink enough water and fluids to keep your urine clear or pale yellow. Water, juice, or caffeine-free drinks are recommended. Not drinking enough fluid may cause constipation.   Eat a variety of high-fiber foods rather than one type of fiber.   Try to increase your intake of fiber through using high-fiber foods rather than fiber pills or supplements that contain small amounts of fiber.   The goal is to change the types of food eaten. Do not supplement your present diet with high-fiber foods, but replace foods in your present diet.  INCLUDE A VARIETY OF FIBER SOURCES  Replace refined and processed grains with whole grains, canned fruits with fresh fruits, and incorporate other fiber sources. White rice, white breads, and most bakery goods contain little or no fiber.   Brown whole-grain rice, buckwheat oats, and many fruits and vegetables are all good sources of fiber. These include: broccoli, Brussels sprouts, cabbage, cauliflower, beets, sweet potatoes, white potatoes (skin on), carrots, tomatoes, eggplant, squash, berries, fresh fruits, and dried fruits.   Cereals appear to be the richest source of fiber. Cereal fiber is found in whole grains and bran. Bran is the fiber-rich outer coat of cereal grain, which is largely removed in refining. In whole-grain cereals, the bran remains. In breakfast cereals, the largest amount of fiber is found in those with "bran" in their names. The fiber content is sometimes indicated on the label.   You may need to include additional fruits and vegetables each day.   In baking, for 1 cup  white flour, you may use the following substitutions:   1 cup whole-wheat flour minus 2 tablespoons.   1/2 cup white flour plus 1/2 cup whole-wheat flour.

## 2013-05-22 ENCOUNTER — Other Ambulatory Visit: Payer: Self-pay

## 2013-06-27 ENCOUNTER — Encounter: Payer: Self-pay | Admitting: Gastroenterology

## 2013-09-24 ENCOUNTER — Telehealth: Payer: Self-pay | Admitting: Gastroenterology

## 2013-09-24 NOTE — Telephone Encounter (Signed)
PLEASE CALL PT. CONTINUE LINZESS. THERE IS NO CHEAPER ALTERNATIVE. FIRST AVAILABLE E 30 VISIT FOR ABD PAIN, CONSTIPATION, ELEVATED LIVERENZYMES.

## 2013-09-24 NOTE — Telephone Encounter (Signed)
I called pt. She said her bowels were doing very well when she had Linzess 145 mcg to take one daily. She has been out. Her insurance will not cover. She tries Doculax and she still cannot get that much help unless she takes too much. I asked her about her weight and she said that it had been recommended to her to lose weight . She is around 155 lbs now. ( Last weight here was 149 lbs). She said her rheumatology doctor said she is losing muscle mass.   I told her I will leave her some samples of the Linzess 145 mcg at the front for pick up and she can take one 30 min before breakfast daily.   I told her Dr. Oneida Alar will advise on other recommendations.

## 2013-09-24 NOTE — Telephone Encounter (Signed)
Pt is wanting to make ASAP OV due to her bowels giving her problems, medicine isn't really helping and she states she is losing weight and muscle mass. I told her the next available would be with [facility] on April 8 but she would like to speak with the nurse to see what she or SF can advise her to do. [phone]

## 2013-09-26 ENCOUNTER — Encounter: Payer: Self-pay | Admitting: Gastroenterology

## 2013-09-29 NOTE — Telephone Encounter (Signed)
Pt is aware of OV on 4/9 at 2 with SF and appt card was mailed

## 2013-10-02 NOTE — Telephone Encounter (Signed)
Called and informed pt. She said she has had a change in her insurance and she will try to come by the office tomorrow and let Manuela Schwartz make a copy and see if she needs a referral.

## 2013-10-23 ENCOUNTER — Telehealth: Payer: Self-pay | Admitting: Gastroenterology

## 2013-10-23 ENCOUNTER — Encounter (INDEPENDENT_AMBULATORY_CARE_PROVIDER_SITE_OTHER): Payer: Medicare Other | Admitting: Gastroenterology

## 2013-10-23 NOTE — Telephone Encounter (Signed)
REVIEWED.  

## 2013-10-23 NOTE — Progress Notes (Deleted)
   Subjective:    Patient ID: Sharon Spencer, female    DOB: 1951-05-04, 63 y.o.   MRN: [id number] Alonza Bogus, MD  HPI    Review of Systems     Objective:   Physical Exam        Assessment & Plan:

## 2013-10-23 NOTE — Progress Notes (Signed)
ERROR

## 2013-10-23 NOTE — Telephone Encounter (Signed)
Pt was a no show

## 2013-10-24 ENCOUNTER — Other Ambulatory Visit: Payer: Self-pay | Admitting: Gastroenterology

## 2014-01-08 ENCOUNTER — Ambulatory Visit (INDEPENDENT_AMBULATORY_CARE_PROVIDER_SITE_OTHER): Payer: Medicare Other | Admitting: Gastroenterology

## 2014-01-08 ENCOUNTER — Encounter: Payer: Self-pay | Admitting: Gastroenterology

## 2014-01-08 VITALS — BP 124/81 | HR 62 | Temp 97.1°F | Ht 61.0 in | Wt 143.8 lb

## 2014-01-08 DIAGNOSIS — K5900 Constipation, unspecified: Secondary | ICD-10-CM

## 2014-01-08 DIAGNOSIS — R1310 Dysphagia, unspecified: Secondary | ICD-10-CM

## 2014-01-08 DIAGNOSIS — R748 Abnormal levels of other serum enzymes: Secondary | ICD-10-CM

## 2014-01-08 DIAGNOSIS — K219 Gastro-esophageal reflux disease without esophagitis: Secondary | ICD-10-CM

## 2014-01-08 NOTE — Patient Instructions (Signed)
DRINK WATER TO KEEP YOUR URINE LIGHT YELLOW.  FOLLOW A HIGH FIBER/LOW FAT DIET. SEE INFO BELOW.  [place].  PLEASE CALL WITH QUESTIONS OR CONCERNS.  FOLLOW UP IN 4 MOS. YOU SHOULD HAVE YOUR LIVER ENZYMES CHECKED WITHINTEH NEXT 4 MOS.

## 2014-01-08 NOTE — Assessment & Plan Note (Addendum)
LOST 16 LBS INTENTIONALLY-BMI 27  RECHECK HFP AFTER NEXT VISIT.

## 2014-01-08 NOTE — Assessment & Plan Note (Signed)
FAIRLY WELL CONTROLLED.  PROTONIX BID LOW FAT DIET OPV IN 4 MOS

## 2014-01-08 NOTE — Progress Notes (Signed)
 Subjective:    Patient ID: Sharon Spencer, female    DOB: 10/30/1950, 62 y.o.   MRN: [id number]  HAWKINS,EDWARD L, MD  HPI GETTING EXERCISE. SWALLOWING PROBLEMS OCCASIONALLY. BMs: ONCE A WEEK WITH HELP(DULCOLAX, SENNA). SUBJECTIVE FEVER/CHILLS. NAUSEA AND SOMETIMES VOMITING WHEN SHE NEEDS TO HAVE A BM. DOES NOT HAVE THE ABILITY TO PUSH. LINZESS IS HELPING. INSURANCE CHANGING JUL 1. HEARTBURN BETTER AND PROTONIX WORKS IF SHE TAKES IT AT THE RIGHT TIME. MILD LLQ ABD PAIN.   PT DENIES  HEMATOCHEZIA,  melena, diarrhea, CHEST PAIN, OR SHORTNESS OF BREATH.      Past Medical History  Diagnosis Date  . TIA (transient ischemic attack) 1991, 2006  . Lumbago   . Rheumatoid arthritis(714.0)     sepcialist at [facility], Dr. Kate Mitchell  . Anxiety   . Depression   . Osteoporosis   . HTN (hypertension)   . Recurrent UTI     specialist at [facility]  . Migraine   . Carpal tunnel syndrome   . Myalgia and myositis, unspecified   . Fibromyalgia   . Arthritis     rheumatoid  . PONV (postoperative nausea and vomiting)   . Sinusitis   . Elevated liver enzymes 02/27/2013    AUG 2014 GG 470 ALT 41 AST 32 ALK PHOS 275    Past Surgical History  Procedure Laterality Date  . Back surgery      lower  . Shoulder surgery      left  . Rotator cuff repair    . Tonsillectomy    . Nasal sinus surgery      x3  . Tubal ligation    . Partial hysterectomy    . Colonoscopy  2003    Dr. Rehman--> ischemic colitis  . Colonoscopy with propofol  06/04/2012    SLF:A sessile polyp measuring 3 mm   . Esophagogastroduodenoscopy (egd) with propofol  06/04/2012    SLF:Schatzki ring was found at the gastroesophageal junction/SIGMOID ESOPHAGUS(DISTAL)/Non-erosive gastritis (inflammation)  . Savory dilation  06/04/2012    Procedure: SAVORY DILATION;  Surgeon: Sandi L Fields, MD;  Location: [facility];  Service: Endoscopy;  Laterality: N/A;  12mm, 12.8 mm, 14 mm  . Polypectomy  06/04/2012    Procedure: POLYPECTOMY;  Surgeon:  Sandi L Fields, MD;  Location: [facility];  Service: Endoscopy;  Laterality: N/A;  . Esophagogastroduodenoscopy (egd) with esophageal dilation  06/17/2012    SLF:Schatzki ring was found at the gastroesophageal junction/Non-erosive gastritis (inflammation)    Allergies  Allergen Reactions  . Compazine [Prochlorperazine Edisylate] Other (See Comments)    "pretzel effect"  . Sulfa Antibiotics Nausea And Vomiting    Current Outpatient Prescriptions  Medication Sig Dispense Refill  . ALPRAZolam (XANAX) 0.5 MG tablet Take 0.5 mg by mouth at bedtime as needed. Hypertension.      . amLODipine (NORVASC) 10 MG tablet Take 10 mg by mouth daily.       . atenolol (TENORMIN) 50 MG tablet Take 50 mg by mouth daily.       . clobetasol cream (TEMOVATE) 0.05 % Apply 1 application topically 2 (two) times daily.       . CYMBALTA 60 MG capsule Take 60 mg by mouth daily.       . hydroxychloroquine (PLAQUENIL) 200 MG tablet Take 400 mg by mouth daily.       . oxycodone (OXY-IR) 5 MG capsule Take 5 mg by mouth every 6 (six) hours as needed for pain.      .   pantoprazole (PROTONIX) 40 MG tablet Take 40 mg by mouth 2 (two) times daily. 30 mins prior to breakfast and supper      . predniSONE (DELTASONE) 5 MG tablet Take 10 mg by mouth daily.       Marland Kitchen PROLIA 60 MG/ML SOLN injection Inject 60 mg into the skin every 6 (six) months.       Marland Kitchen tiZANidine (ZANAFLEX) 4 MG tablet Take 4-8 mg by mouth daily as needed.       . zolpidem (AMBIEN) 5 MG tablet Take 5 mg by mouth at bedtime as needed for sleep.      Marland Kitchen adalimumab (HUMIRA) 40 MG/0.8ML injection Inject 40 mg into the skin every 14 (fourteen) days.      Marland Kitchen alendronate (FOSAMAX) 70 MG tablet Take 70 mg by mouth every 7 (seven) days. Take with a full glass of water on an empty stomach.      . lactulose (CHRONULAC) 10 GM/15ML solution Take 10 g by mouth 2 (two) times daily.    . Linaclotide (LINZESS) 145 MCG CAPS capsule 1 PO 30 mins prior to your first meal    . pantoprazole  (PROTONIX) 40 MG tablet TAKE 1 TABLET TWICE DAILY 30 MINUTES PRIOR TO BREAKFAST AND SUPPER       Review of Systems     Objective:   Physical Exam  Vitals reviewed. Constitutional: She is oriented to person, place, and time. She appears well-developed and well-nourished. No distress.  HENT:  Head: Normocephalic and atraumatic.  Mouth/Throat: Oropharynx is clear and moist. No oropharyngeal exudate.  Eyes: Pupils are equal, round, and reactive to light. No scleral icterus.  Neck: Normal range of motion. Neck supple.  Cardiovascular: Normal rate, regular rhythm and normal heart sounds.   Pulmonary/Chest: Effort normal and breath sounds normal. No respiratory distress.  Abdominal: Soft. Bowel sounds are normal. She exhibits no distension. There is tenderness. There is no rebound and no guarding.  MODERATE TTP IN RUQ.  Musculoskeletal: She exhibits no edema.  Neurological: She is alert and oriented to person, place, and time.  NO  NEW FOCAL DEFICITS   Psychiatric:  SLIGHTLY ANXIOUS MOOD, NL AFFECT          Assessment & Plan:

## 2014-01-08 NOTE — Assessment & Plan Note (Signed)
OCCASIONAL SYMPTOMS.  CONTINUE TO MONITOR SYMPTOMS.

## 2014-01-08 NOTE — Progress Notes (Signed)
cc'd to pcp 

## 2014-01-08 NOTE — Assessment & Plan Note (Signed)
IMPROVED WITH LINZESS.  TRY 290 PILLS. CALL IF IT WORKS BETTER. OTHERWISE CONTINUE LINZESS 145 MCG DAILY. DRINK WATER EAT FIBER OPV IN 4 MOS

## 2014-01-12 NOTE — Progress Notes (Signed)
Reminder in EPIC 

## 2014-01-20 DIAGNOSIS — M069 Rheumatoid arthritis, unspecified: Secondary | ICD-10-CM | POA: Diagnosis not present

## 2014-01-20 DIAGNOSIS — Z79899 Other long term (current) drug therapy: Secondary | ICD-10-CM | POA: Diagnosis not present

## 2014-01-20 DIAGNOSIS — R0602 Shortness of breath: Secondary | ICD-10-CM | POA: Diagnosis not present

## 2014-01-27 DIAGNOSIS — H43399 Other vitreous opacities, unspecified eye: Secondary | ICD-10-CM | POA: Diagnosis not present

## 2014-02-17 DIAGNOSIS — Z79899 Other long term (current) drug therapy: Secondary | ICD-10-CM | POA: Diagnosis not present

## 2014-03-04 DIAGNOSIS — R350 Frequency of micturition: Secondary | ICD-10-CM | POA: Diagnosis not present

## 2014-03-04 DIAGNOSIS — R3915 Urgency of urination: Secondary | ICD-10-CM | POA: Diagnosis not present

## 2014-03-04 DIAGNOSIS — R3129 Other microscopic hematuria: Secondary | ICD-10-CM | POA: Diagnosis not present

## 2014-03-16 DIAGNOSIS — R748 Abnormal levels of other serum enzymes: Secondary | ICD-10-CM | POA: Diagnosis not present

## 2014-03-16 DIAGNOSIS — M069 Rheumatoid arthritis, unspecified: Secondary | ICD-10-CM | POA: Diagnosis not present

## 2014-03-16 DIAGNOSIS — Z79899 Other long term (current) drug therapy: Secondary | ICD-10-CM | POA: Diagnosis not present

## 2014-03-24 DIAGNOSIS — F329 Major depressive disorder, single episode, unspecified: Secondary | ICD-10-CM | POA: Diagnosis not present

## 2014-03-24 DIAGNOSIS — R7309 Other abnormal glucose: Secondary | ICD-10-CM | POA: Diagnosis not present

## 2014-03-24 DIAGNOSIS — M069 Rheumatoid arthritis, unspecified: Secondary | ICD-10-CM | POA: Diagnosis not present

## 2014-03-24 DIAGNOSIS — E2749 Other adrenocortical insufficiency: Secondary | ICD-10-CM | POA: Diagnosis not present

## 2014-03-24 DIAGNOSIS — F3289 Other specified depressive episodes: Secondary | ICD-10-CM | POA: Diagnosis not present

## 2014-03-24 DIAGNOSIS — F411 Generalized anxiety disorder: Secondary | ICD-10-CM | POA: Diagnosis not present

## 2014-03-24 DIAGNOSIS — K2100 Gastro-esophageal reflux disease with esophagitis, without bleeding: Secondary | ICD-10-CM | POA: Diagnosis not present

## 2014-03-25 ENCOUNTER — Other Ambulatory Visit (HOSPITAL_COMMUNITY): Payer: Self-pay | Admitting: Pulmonary Disease

## 2014-03-25 DIAGNOSIS — I639 Cerebral infarction, unspecified: Secondary | ICD-10-CM

## 2014-03-26 ENCOUNTER — Encounter: Payer: Self-pay | Admitting: Gastroenterology

## 2014-03-30 ENCOUNTER — Ambulatory Visit (HOSPITAL_COMMUNITY): Payer: Medicare Other

## 2014-03-30 DIAGNOSIS — M069 Rheumatoid arthritis, unspecified: Secondary | ICD-10-CM | POA: Diagnosis not present

## 2014-04-08 ENCOUNTER — Ambulatory Visit (HOSPITAL_COMMUNITY)
Admission: RE | Admit: 2014-04-08 | Discharge: 2014-04-08 | Disposition: A | Discharge status: Home / Self Care | Payer: Medicare Other | Source: Ambulatory Visit | Attending: Pulmonary Disease | Admitting: Pulmonary Disease

## 2014-04-08 DIAGNOSIS — R5383 Other fatigue: Secondary | ICD-10-CM

## 2014-04-08 DIAGNOSIS — R209 Unspecified disturbances of skin sensation: Secondary | ICD-10-CM | POA: Insufficient documentation

## 2014-04-08 DIAGNOSIS — R5381 Other malaise: Secondary | ICD-10-CM | POA: Insufficient documentation

## 2014-04-08 DIAGNOSIS — I639 Cerebral infarction, unspecified: Secondary | ICD-10-CM

## 2014-04-09 DIAGNOSIS — Z79899 Other long term (current) drug therapy: Secondary | ICD-10-CM | POA: Diagnosis not present

## 2014-04-09 DIAGNOSIS — R350 Frequency of micturition: Secondary | ICD-10-CM | POA: Diagnosis not present

## 2014-04-09 DIAGNOSIS — R3915 Urgency of urination: Secondary | ICD-10-CM | POA: Diagnosis not present

## 2014-04-09 DIAGNOSIS — N318 Other neuromuscular dysfunction of bladder: Secondary | ICD-10-CM | POA: Diagnosis not present

## 2014-04-09 DIAGNOSIS — R339 Retention of urine, unspecified: Secondary | ICD-10-CM | POA: Diagnosis not present

## 2014-04-14 DIAGNOSIS — M069 Rheumatoid arthritis, unspecified: Secondary | ICD-10-CM | POA: Diagnosis not present

## 2014-04-15 DIAGNOSIS — R7309 Other abnormal glucose: Secondary | ICD-10-CM | POA: Diagnosis not present

## 2014-04-15 DIAGNOSIS — I10 Essential (primary) hypertension: Secondary | ICD-10-CM | POA: Diagnosis not present

## 2014-04-15 DIAGNOSIS — E2749 Other adrenocortical insufficiency: Secondary | ICD-10-CM | POA: Diagnosis not present

## 2014-04-21 DIAGNOSIS — M797 Fibromyalgia: Secondary | ICD-10-CM | POA: Diagnosis not present

## 2014-04-21 DIAGNOSIS — M0579 Rheumatoid arthritis with rheumatoid factor of multiple sites without organ or systems involvement: Secondary | ICD-10-CM | POA: Diagnosis not present

## 2014-04-21 DIAGNOSIS — Z79899 Other long term (current) drug therapy: Secondary | ICD-10-CM | POA: Diagnosis not present

## 2014-04-23 DIAGNOSIS — Z23 Encounter for immunization: Secondary | ICD-10-CM | POA: Diagnosis not present

## 2014-05-11 DIAGNOSIS — M0609 Rheumatoid arthritis without rheumatoid factor, multiple sites: Secondary | ICD-10-CM | POA: Diagnosis not present

## 2014-05-11 DIAGNOSIS — M0579 Rheumatoid arthritis with rheumatoid factor of multiple sites without organ or systems involvement: Secondary | ICD-10-CM | POA: Diagnosis not present

## 2014-05-28 ENCOUNTER — Other Ambulatory Visit (HOSPITAL_COMMUNITY): Payer: Self-pay | Admitting: Pulmonary Disease

## 2014-05-28 DIAGNOSIS — Z0000 Encounter for general adult medical examination without abnormal findings: Secondary | ICD-10-CM | POA: Diagnosis not present

## 2014-05-28 DIAGNOSIS — Z1231 Encounter for screening mammogram for malignant neoplasm of breast: Secondary | ICD-10-CM

## 2014-06-08 DIAGNOSIS — M0579 Rheumatoid arthritis with rheumatoid factor of multiple sites without organ or systems involvement: Secondary | ICD-10-CM | POA: Diagnosis not present

## 2014-06-15 ENCOUNTER — Ambulatory Visit (INDEPENDENT_AMBULATORY_CARE_PROVIDER_SITE_OTHER): Payer: Medicare Other | Admitting: Gastroenterology

## 2014-06-15 ENCOUNTER — Encounter: Payer: Self-pay | Admitting: Gastroenterology

## 2014-06-15 ENCOUNTER — Ambulatory Visit (HOSPITAL_COMMUNITY): Payer: Medicare Other

## 2014-06-15 ENCOUNTER — Ambulatory Visit (HOSPITAL_COMMUNITY)
Admission: RE | Admit: 2014-06-15 | Discharge: 2014-06-15 | Disposition: A | Discharge status: Home / Self Care | Payer: Medicare Other | Source: Ambulatory Visit | Attending: Pulmonary Disease | Admitting: Pulmonary Disease

## 2014-06-15 VITALS — BP 122/81 | HR 63 | Temp 97.6°F | Ht 61.0 in | Wt 143.0 lb

## 2014-06-15 DIAGNOSIS — Z1231 Encounter for screening mammogram for malignant neoplasm of breast: Secondary | ICD-10-CM | POA: Insufficient documentation

## 2014-06-15 DIAGNOSIS — R748 Abnormal levels of other serum enzymes: Secondary | ICD-10-CM | POA: Diagnosis not present

## 2014-06-15 DIAGNOSIS — K5901 Slow transit constipation: Secondary | ICD-10-CM

## 2014-06-15 DIAGNOSIS — K219 Gastro-esophageal reflux disease without esophagitis: Secondary | ICD-10-CM

## 2014-06-15 DIAGNOSIS — R1310 Dysphagia, unspecified: Secondary | ICD-10-CM | POA: Diagnosis not present

## 2014-06-15 DIAGNOSIS — I639 Cerebral infarction, unspecified: Secondary | ICD-10-CM | POA: Diagnosis not present

## 2014-06-15 MED ORDER — LINACLOTIDE 145 MCG PO CAPS: ORAL_CAPSULE | ORAL | Status: DC

## 2014-06-15 MED ORDER — PANTOPRAZOLE SODIUM 40 MG PO TBEC: DELAYED_RELEASE_TABLET | ORAL | Status: DC

## 2014-06-15 NOTE — Assessment & Plan Note (Addendum)
CLINICALLY STABLE. WEIGHT DOWN. DIABETES TREATED ON STEROIDS FOR RA.  REPEAT HFP IN DEC 2015.

## 2014-06-15 NOTE — Assessment & Plan Note (Signed)
MILD SYMPTOMS.  CONTINUE TO MONITOR SYMPTOMS. CONSIDER BPE +/- EGD/DIL IF SX WORSEN FOLLOW UP IN 1 YEAR.

## 2014-06-15 NOTE — Progress Notes (Signed)
On recall list 

## 2014-06-15 NOTE — Patient Instructions (Signed)
REPEAT LIVER ENZYME TEST IN DEC 2015.  CONTINUE PROTONIX. TAKE 30 MINUTES PRIOR TO MEALS TWICE DAILY.  CONTINUE LINZESS TO PREVENT DIARRHEA.  DRINK WATER TO KEEP YOUR URINE LIGHT YELLOW.  EAT FIBER.  FOLLOW UP IN 1 YEAR.   PLEASE CALL WITH QUESTIONS OR CONCERNS ABOUT DIFFICULTY SWALLOWING.

## 2014-06-15 NOTE — Assessment & Plan Note (Signed)
SX FAIRLY WELL CONTROLLED.  CONTINUE TO MONITOR SYMPTOMS. RENEW PROTONIX x 1 YR. CONTINUE PROTONIX. TAKE 30 MINUTES PRIOR TO MEALS TWICE DAILY. FOLLOW UP IN 1 YEAR.

## 2014-06-15 NOTE — Assessment & Plan Note (Addendum)
SX FAIRLY WELL CONTROLLED,  CONTINUE TO MONITOR SYMPTOMS. LINZESS TO PREVENT CONSTIPATION DRINK WATER EAT FIBER FOLLOW UP IN 1 YEAR.

## 2014-06-15 NOTE — Progress Notes (Signed)
Subjective:    Patient ID: Sharon Spencer, female    DOB: Dec 25, 1950, 63 y.o.   MRN: [id number]  Alonza Bogus, MD  HPI OCCASIONAL PROBLEMS SWALLOWING WITH EATING OR DRINKING: 1-2X/WEEK. HAPPENS RANDOMLY. GETS OVER IT AND SHE'S OK. LASTS FOR ~2-3 HRS. NOT AS MUCH PROBLEMS SWALLOWING HER MEDS. BMs: BETTER. MAY HAVE TROUBLE WITH ABDOMINAL GAS AFTER STARTING NEW MEDS. USING SAMPLES OF LINZESS PRN. HEARTBURN FAIRLY WELL CONTROLLED DEPENDING ON WHAT SHE EATS OR STRESS. HAD A LITTLE BLOOD IN STOOL(IN POTTY/STOOL, AND ON TISSUE) AFTER HER HEMORRHOIDS FLARED WHEN HAVING DIARRHEA. NAUSEA/VOMTIING: IMPROVED. LAST FLARE: 1 MO AGO(TRIGGER: ??PIZZA). BELLY PAIN WHEN SHE HAS A FLARE. WEIGHT: 143 LBS.  PT DENIES FEVER, CHILLS, nausea, vomiting, melena, CHEST PAIN, SHORTNESS OF BREATH,  OR CHANGE IN BOWEL IN HABITS.   Past Medical History  Diagnosis Date  . TIA (transient ischemic attack) 1991, 2006  . Lumbago   . Rheumatoid arthritis(714.0)     sepcialist at [facility], Dr. Lynwood Dawley  . Anxiety   . Depression   . Osteoporosis   . HTN (hypertension)   . Recurrent UTI     specialist at [facility]  . Migraine   . Carpal tunnel syndrome   . Myalgia and myositis, unspecified   . Fibromyalgia   . Arthritis     rheumatoid  . PONV (postoperative nausea and vomiting)   . Sinusitis   . Elevated liver enzymes 02/27/2013    AUG 2014 GG 470 ALT 41 AST 32 ALK PHOS 275    Past Surgical History  Procedure Laterality Date  . Back surgery      lower  . Shoulder surgery      left  . Rotator cuff repair    . Tonsillectomy    . Nasal sinus surgery      x3  . Tubal ligation    . Partial hysterectomy    . Colonoscopy  2003    Dr. Rehman--> ischemic colitis  . Colonoscopy with propofol  06/04/2012    SLF:A sessile polyp measuring 3 mm   . Esophagogastroduodenoscopy (egd) with propofol  06/04/2012    LYY:TKPTWSFK ring was found at the gastroesophageal junction/SIGMOID ESOPHAGUS(DISTAL)/Non-erosive gastritis  (inflammation)  . Savory dilation  06/04/2012    Procedure: SAVORY DILATION;  Surgeon: Danie Binder, MD;  Location: [facility];  Service: Endoscopy;  Laterality: N/A;  22m, 12.8 mm, 14 mm  . Polypectomy  06/04/2012    Procedure: POLYPECTOMY;  Surgeon: SDanie Binder MD;  Location: [facility];  Service: Endoscopy;  Laterality: N/A;  . Esophagogastroduodenoscopy (egd) with esophageal dilation  06/17/2012    SCLE:XNTZGYFVring was found at the gastroesophageal junction/Non-erosive gastritis (inflammation)   Allergies  Allergen Reactions  . Compazine [Prochlorperazine Edisylate] Other (See Comments)    "pretzel effect"  . Sulfa Antibiotics Nausea And Vomiting   Current Outpatient Prescriptions  Medication Sig Dispense Refill  . Abatacept (ORENCIA IV) Inject into the vein every 30 (thirty) days.    . ALPRAZolam (XANAX) 0.5 MG tablet Take 0.5 mg by mouth at bedtime as needed. Hypertension.    .Marland KitchenamLODipine (NORVASC) 10 MG tablet Take 10 mg by mouth daily.     .Marland Kitchenatenolol (TENORMIN) 50 MG tablet Take 50 mg by mouth daily.     . clobetasol cream (TEMOVATE) 04.94% Apply 1 application topically 2 (two) times daily.     . CYMBALTA 60 MG capsule Take 60 mg by mouth daily.     . hydroxychloroquine (PLAQUENIL) 200  MG tablet Take 400 mg by mouth daily.     . Linaclotide (LINZESS) 145 MCG CAPS capsule 1 PO 30 mins prior to your first meal    . metFORMIN (GLUCOPHAGE) 500 MG tablet Take 500 mg by mouth 2 (two) times daily with a meal.  MAY GIVE HER DIARRHEA   . oxycodone (OXY-IR) 5 MG capsule Take 5 mg by mouth every 6 (six) hours as needed for pain.    . pantoprazole (PROTONIX) 40 MG tablet Take 40 mg by mouth 2 (two) times daily. 30 mins prior to breakfast and supper    . predniSONE (DELTASONE) 5 MG tablet Take 10 mg by mouth daily.     Marland Kitchen PROLIA 60 MG/ML SOLN injection Inject 60 mg into the skin every 6 (six) months.     Marland Kitchen tiZANidine (ZANAFLEX) 4 MG tablet Take 4-8 mg by mouth daily as needed.     . traZODone  (DESYREL) 50 MG tablet Take 50 mg by mouth at bedtime.    Marland Kitchen adalimumab (HUMIRA) 40 MG/0.8ML injection Inject 40 mg into the skin every 14 (fourteen) days.    Marland Kitchen alendronate (FOSAMAX) 70 MG tablet Take 70 mg by mouth every 7 (seven) days. Take with a full glass of water on an empty stomach.    . lactulose (CHRONULAC) 10 GM/15ML solution Take 10 g by mouth 2 (two) times daily. Not really taking. Made her nauseaous   .      . zolpidem (AMBIEN) 5 MG tablet Take 5 mg by mouth at bedtime as needed for sleep.        Review of Systems     Objective:   Physical Exam  Constitutional: She is oriented to person, place, and time. She appears well-developed and well-nourished. No distress.  HENT:  Head: Normocephalic and atraumatic.  Mouth/Throat: Oropharynx is clear and moist. No oropharyngeal exudate.  Eyes: Pupils are equal, round, and reactive to light. No scleral icterus.  Neck: Normal range of motion. Neck supple.  BUFFALO HUMP  Cardiovascular: Normal rate, regular rhythm and normal heart sounds.   Pulmonary/Chest: Effort normal and breath sounds normal. No respiratory distress.  Abdominal: Soft. Bowel sounds are normal. She exhibits no distension. There is tenderness. There is no rebound and no guarding.  MILD left PERI-UMBILICAL TTP   Musculoskeletal: She exhibits no edema.  Lymphadenopathy:    She has no cervical adenopathy.  Neurological: She is alert and oriented to person, place, and time.  NO  NEW FOCAL DEFICITS   Psychiatric: She has a normal mood and affect.  Vitals reviewed.         Assessment & Plan:

## 2014-06-16 NOTE — Progress Notes (Signed)
cc'ed to pcp °

## 2014-06-22 DIAGNOSIS — Z79899 Other long term (current) drug therapy: Secondary | ICD-10-CM | POA: Diagnosis not present

## 2014-06-22 DIAGNOSIS — M0579 Rheumatoid arthritis with rheumatoid factor of multiple sites without organ or systems involvement: Secondary | ICD-10-CM | POA: Diagnosis not present

## 2014-07-08 DIAGNOSIS — E2740 Unspecified adrenocortical insufficiency: Secondary | ICD-10-CM | POA: Diagnosis not present

## 2014-07-08 DIAGNOSIS — E119 Type 2 diabetes mellitus without complications: Secondary | ICD-10-CM | POA: Diagnosis not present

## 2014-07-08 DIAGNOSIS — I10 Essential (primary) hypertension: Secondary | ICD-10-CM | POA: Diagnosis not present

## 2014-07-08 DIAGNOSIS — E559 Vitamin D deficiency, unspecified: Secondary | ICD-10-CM | POA: Diagnosis not present

## 2014-07-08 DIAGNOSIS — E1165 Type 2 diabetes mellitus with hyperglycemia: Secondary | ICD-10-CM | POA: Diagnosis not present

## 2014-07-13 DIAGNOSIS — M0579 Rheumatoid arthritis with rheumatoid factor of multiple sites without organ or systems involvement: Secondary | ICD-10-CM | POA: Diagnosis not present

## 2014-07-31 DIAGNOSIS — E2740 Unspecified adrenocortical insufficiency: Secondary | ICD-10-CM | POA: Diagnosis not present

## 2014-07-31 DIAGNOSIS — I10 Essential (primary) hypertension: Secondary | ICD-10-CM | POA: Diagnosis not present

## 2014-07-31 DIAGNOSIS — E119 Type 2 diabetes mellitus without complications: Secondary | ICD-10-CM | POA: Diagnosis not present

## 2014-07-31 DIAGNOSIS — E559 Vitamin D deficiency, unspecified: Secondary | ICD-10-CM | POA: Diagnosis not present

## 2014-08-05 ENCOUNTER — Other Ambulatory Visit (HOSPITAL_COMMUNITY): Payer: Self-pay | Admitting: Pulmonary Disease

## 2014-08-05 DIAGNOSIS — Z780 Asymptomatic menopausal state: Secondary | ICD-10-CM

## 2014-08-05 DIAGNOSIS — M069 Rheumatoid arthritis, unspecified: Secondary | ICD-10-CM

## 2014-08-12 ENCOUNTER — Other Ambulatory Visit (HOSPITAL_COMMUNITY): Payer: Medicare Other

## 2014-08-18 DIAGNOSIS — M0579 Rheumatoid arthritis with rheumatoid factor of multiple sites without organ or systems involvement: Secondary | ICD-10-CM | POA: Diagnosis not present

## 2014-08-19 ENCOUNTER — Other Ambulatory Visit (HOSPITAL_COMMUNITY): Payer: Medicare Other

## 2014-08-25 ENCOUNTER — Ambulatory Visit (HOSPITAL_COMMUNITY)
Admission: RE | Admit: 2014-08-25 | Discharge: 2014-08-25 | Disposition: A | Discharge status: Home / Self Care | Payer: Medicare Other | Source: Ambulatory Visit | Attending: Pulmonary Disease | Admitting: Pulmonary Disease

## 2014-08-25 DIAGNOSIS — Z780 Asymptomatic menopausal state: Secondary | ICD-10-CM | POA: Diagnosis not present

## 2014-08-25 DIAGNOSIS — M069 Rheumatoid arthritis, unspecified: Secondary | ICD-10-CM | POA: Insufficient documentation

## 2014-08-25 DIAGNOSIS — M810 Age-related osteoporosis without current pathological fracture: Secondary | ICD-10-CM | POA: Diagnosis not present

## 2014-08-25 DIAGNOSIS — R1030 Lower abdominal pain, unspecified: Secondary | ICD-10-CM | POA: Diagnosis not present

## 2014-09-10 DIAGNOSIS — K210 Gastro-esophageal reflux disease with esophagitis: Secondary | ICD-10-CM | POA: Diagnosis not present

## 2014-09-10 DIAGNOSIS — I10 Essential (primary) hypertension: Secondary | ICD-10-CM | POA: Diagnosis not present

## 2014-09-10 DIAGNOSIS — M069 Rheumatoid arthritis, unspecified: Secondary | ICD-10-CM | POA: Diagnosis not present

## 2014-09-14 ENCOUNTER — Encounter: Payer: Self-pay | Admitting: Gastroenterology

## 2014-09-15 DIAGNOSIS — M0579 Rheumatoid arthritis with rheumatoid factor of multiple sites without organ or systems involvement: Secondary | ICD-10-CM | POA: Diagnosis not present

## 2014-09-21 DIAGNOSIS — Z79899 Other long term (current) drug therapy: Secondary | ICD-10-CM | POA: Diagnosis not present

## 2014-09-21 DIAGNOSIS — M797 Fibromyalgia: Secondary | ICD-10-CM | POA: Diagnosis not present

## 2014-09-21 DIAGNOSIS — M0579 Rheumatoid arthritis with rheumatoid factor of multiple sites without organ or systems involvement: Secondary | ICD-10-CM | POA: Diagnosis not present

## 2014-09-21 DIAGNOSIS — R05 Cough: Secondary | ICD-10-CM | POA: Diagnosis not present

## 2014-09-21 DIAGNOSIS — E2740 Unspecified adrenocortical insufficiency: Secondary | ICD-10-CM | POA: Diagnosis not present

## 2014-09-21 DIAGNOSIS — M25552 Pain in left hip: Secondary | ICD-10-CM | POA: Diagnosis not present

## 2014-10-01 ENCOUNTER — Encounter: Payer: Self-pay | Admitting: Gastroenterology

## 2014-10-01 ENCOUNTER — Other Ambulatory Visit: Payer: Self-pay

## 2014-10-01 ENCOUNTER — Ambulatory Visit (INDEPENDENT_AMBULATORY_CARE_PROVIDER_SITE_OTHER): Payer: Medicare Other | Admitting: Gastroenterology

## 2014-10-01 VITALS — BP 115/74 | HR 68 | Temp 98.2°F | Ht 61.0 in | Wt 147.2 lb

## 2014-10-01 DIAGNOSIS — M069 Rheumatoid arthritis, unspecified: Secondary | ICD-10-CM | POA: Diagnosis not present

## 2014-10-01 DIAGNOSIS — K219 Gastro-esophageal reflux disease without esophagitis: Secondary | ICD-10-CM | POA: Diagnosis not present

## 2014-10-01 DIAGNOSIS — R1013 Epigastric pain: Secondary | ICD-10-CM

## 2014-10-01 DIAGNOSIS — R748 Abnormal levels of other serum enzymes: Secondary | ICD-10-CM

## 2014-10-01 DIAGNOSIS — R1310 Dysphagia, unspecified: Secondary | ICD-10-CM | POA: Diagnosis not present

## 2014-10-01 DIAGNOSIS — R197 Diarrhea, unspecified: Secondary | ICD-10-CM | POA: Diagnosis not present

## 2014-10-01 DIAGNOSIS — M791 Myalgia: Secondary | ICD-10-CM | POA: Diagnosis not present

## 2014-10-01 DIAGNOSIS — R1314 Dysphagia, pharyngoesophageal phase: Secondary | ICD-10-CM

## 2014-10-01 NOTE — Progress Notes (Signed)
ON RECALL LIST  °

## 2014-10-01 NOTE — Assessment & Plan Note (Signed)
LAST HFP> 1 YEAR.  HFP WITHIN 7 DAYS FOLLOW UP IN 6 MOS.

## 2014-10-01 NOTE — Assessment & Plan Note (Signed)
NO WARNING SIGNS/SYMPTOMS & LIKELY DUE TO MEDS. SYMPTOMS IMPROVING.  CONTINUE TO MONITOR SYMPTOMS. TAKE A PROBIOTIC DAILY FOR SIX MONTHS ([facility] BRAND, [place]).  FOLLOW UP IN 6 MOS.

## 2014-10-01 NOTE — Assessment & Plan Note (Signed)
SYMPTOMS NOT IDEALLY CONTROLLED.  BPE WITHIN NEXT MO. EGD/DIL IF PILL STOPS AT [place].  MEATS SHOULD BE CHOPPED OR GROUND ONLY. DO NOT EAT CHUNKS OF ANYTHING. FOLLOW UP IN 6 MOS.

## 2014-10-01 NOTE — Assessment & Plan Note (Signed)
SYMPTOMS FAIRLY WELL CONTROLLED.  CONTINUE PROTONIX. TAKE 30 MINUTES PRIOR TO MEALS TWICE DAILY. LOW FAT DIET FOLLOW UP IN 6 MOS.

## 2014-10-01 NOTE — Progress Notes (Signed)
Subjective:    Patient ID: Sharon Spencer, female    DOB: 10-16-1950, 64 y.o.   MRN: [id number]  Alonza Bogus, MD  HPI Dental surgery 1 mo. NO ABX. Choking comes and goes bad. STARTED STOMACH PROBLEMS AFTER DENTAL SURGERY. HAD SORE MOUTH. GIVEN LIQUID TO COAT HER STOMACH. TOOK LIQUID AND THEN STARTED HAVING PROBLEMS WITH BLOATING/ABDOMINAL PAIN. THOUGHT IT WAS DUE TO NO LINZESS. TOOK 2 DOSES OF DULCOLAX. GOT CLEANED OUT. GAS AND BLOATING STAYED AND IT WAS TERRIBLE. ALSO HAD TOOTHPASTE FOR DRY MOUTH. ATE AND STOOLS WOULD BE LIGHT. REPORTS SUBJECTIVE CHILLS. FEELS WEAK IN LEGS AND ANKLES. NAUSEA: 3-4X/WEEK(NO MEDS, BAD-MINS). Trouble swallowing sometimes: soda, solids(3-4x/week). Better after: time, liquids. BMs: daily then none last night. NO LINZESS FOR PAST MO. INDIGESTION: DOESN'T FEEL WELL IN HER CHEST/ABDOMEN. WEIGHT UP 4 LBS SINCE NOV 2015.  PT DENIES FEVER, CHILLS, HEMATOCHEZIA, vomiting, melena, diarrhea, CHEST PAIN, SHORTNESS OF BREATH,  CHANGE IN BOWEL IN HABITS, constipation, OR heartburn.   Past Medical History  Diagnosis Date  . TIA (transient ischemic attack) 1991, 2006  . Lumbago   . Rheumatoid arthritis(714.0)     sepcialist at [facility], Dr. Lynwood Dawley  . Anxiety   . Depression   . Osteoporosis   . HTN (hypertension)   . Recurrent UTI     specialist at [facility]  . Migraine   . Carpal tunnel syndrome   . Myalgia and myositis, unspecified   . Fibromyalgia   . Arthritis     rheumatoid  . PONV (postoperative nausea and vomiting)   . Sinusitis   . Elevated liver enzymes 02/27/2013    AUG 2014 GG 470 ALT 41 AST 32 ALK PHOS 275   . Diabetes mellitus    Past Surgical History  Procedure Laterality Date  . Back surgery      lower  . Shoulder surgery      left  . Rotator cuff repair    . Tonsillectomy    . Nasal sinus surgery      x3  . Tubal ligation    . Partial hysterectomy    . Colonoscopy  2003    Dr. Rehman--> ischemic colitis  . Colonoscopy with propofol   06/04/2012    SLF:A sessile polyp measuring 3 mm   . Esophagogastroduodenoscopy (egd) /dilatrion  06/04/2012    UYE:BXIDHWYS ring/SIGMOID ESOPHAGUS(DISTAL)/Non-erosive gastritis  .                            Allergies  Allergen Reactions  . Compazine [Prochlorperazine Edisylate] Other (See Comments)    "pretzel effect"  . Sulfa Antibiotics Nausea And Vomiting    Current Outpatient Prescriptions  Medication Sig Dispense Refill  . Abatacept (ORENCIA IV) Inject into the vein every 30 (thirty) days.    . ALPRAZolam (XANAX) 0.5 MG tablet Take 0.5 mg by mouth at bedtime as needed. Hypertension.    Marland Kitchen amLODipine (NORVASC) 10 MG tablet Take 10 mg by mouth daily.     Marland Kitchen atenolol (TENORMIN) 50 MG tablet Take 50 mg by mouth daily.     . clobetasol cream (TEMOVATE) 1.68 % Apply 1 application topically 2 (two) times daily.     . CYMBALTA 60 MG capsule Take 60 mg by mouth daily.     . hydroxychloroquine (PLAQUENIL) 200 MG tablet Take 400 mg by mouth daily.     . Linaclotide (LINZESS) 145 MCG CAPS capsule 1 PO 30 mins prior to your  first meal 30 capsule 11  . metFORMIN (GLUCOPHAGE) 500 MG tablet Take 500 mg by mouth 2 (two) times daily with a meal.     . oxycodone (OXY-IR) 5 MG capsule Take 5 mg by mouth every 6 (six) hours as needed for pain.    . pantoprazole (PROTONIX) 40 MG tablet TAKE 1 TABLET TWICE DAILY 30 MINUTES PRIOR TO BREAKFAST AND SUPPER    . predniSONE (DELTASONE) 5 MG tablet Take 10 mg by mouth daily.     Marland Kitchen tiZANidine (ZANAFLEX) 4 MG tablet Take 4-8 mg by mouth daily as needed.     . traZODone (DESYREL) 50 MG tablet Take 50 mg by mouth at bedtime.    Marland Kitchen adalimumab (HUMIRA) 40 MG/0.8ML injection Inject 40 mg into the skin every 14 (fourteen) days.    Marland Kitchen PROLIA 60 MG/ML SOLN injection Inject 60 mg into the skin every 6 (six) months.     . zolpidem (AMBIEN) 5 MG tablet Take 5 mg by mouth at bedtime as needed for sleep.      Review of Systems     Objective:   Physical Exam    Constitutional: She is oriented to person, place, and time. She appears well-developed and well-nourished. No distress.  HENT:  Head: Normocephalic and atraumatic.  Mouth/Throat: Oropharynx is clear and moist. No oropharyngeal exudate.  Eyes: Pupils are equal, round, and reactive to light. No scleral icterus.  Neck: Normal range of motion. Neck supple.  Cardiovascular: Normal rate, regular rhythm and normal heart sounds.   Pulmonary/Chest: Effort normal and breath sounds normal. No respiratory distress.  Abdominal: Soft. Bowel sounds are normal. She exhibits no distension. There is tenderness. There is no rebound and no guarding.  MILD TTP IN THE EPIGASTRIUM/RUQ.  Musculoskeletal: She exhibits edema (1+ BIL LE).  Lymphadenopathy:    She has no cervical adenopathy.  Neurological: She is alert and oriented to person, place, and time.  NO  NEW FOCAL DEFICITS   Psychiatric:  SLIGHTLY ANXIOUS MOOD, NL AFFECT  Vitals reviewed.         Assessment & Plan:

## 2014-10-01 NOTE — Patient Instructions (Addendum)
MEATS SHOULD BE CHOPPED OR GROUND ONLY.  DO NOT EAT CHUNKS OF ANYTHING.  TAKE A PROBIOTIC DAILY FOR SIX MONTHS ([facility] BRAND, [place]).   COMPLETE BARIUM SWALLOW AND LABS.  AVOID ITEMS THAT CAUSE BLOATING & GAS.SEE INFO BELOW.  FOLLOW UP IN 6 MOS.   BLOATING AND GAS PREVENTION  Although gas may be uncomfortable and embarrassing, it is not life-threatening. Understanding causes, ways to reduce symptoms, and treatment will help most people find some relief. Points to remember . Everyone has gas in the digestive tract. Marland Kitchen People often believe normal passage of gas to be excessive. . Gas comes from two main sources: swallowed air and normal breakdown of certain foods by harmless bacteria naturally present in the large intestine. . Many foods with carbohydrates can cause gas. Fats and proteins cause little gas. . Foods that may cause gas include o beans  o vegetables, such as broccoli, cabbage, brussels sprouts, onions, artichokes, and asparagus  o fruits, such as pears, apples, and peaches  o whole grains, such as whole wheat and bran  o soft drinks and fruit drinks  o milk and milk products, such as cheese and ice cream, and packaged foods prepared with lactose, such as bread, cereal, and salad dressing  o foods containing sorbitol, such as dietetic foods and sugar free candies and gums . The most common symptoms of gas are belching, flatulence, bloating, and abdominal pain. However, some of these symptoms are often caused by an intestinal disorder, such as irritable bowel syndrome, rather than too much gas. . The most common ways to reduce the discomfort of gas are changing diet, taking nonprescription medicines, and reducing the amount of air swallowed. . Digestive enzymes, such as lactase supplements, actually help digest carbohydrates and may allow people to eat foods that normally cause gas.

## 2014-10-02 DIAGNOSIS — M069 Rheumatoid arthritis, unspecified: Secondary | ICD-10-CM | POA: Diagnosis not present

## 2014-10-02 DIAGNOSIS — R197 Diarrhea, unspecified: Secondary | ICD-10-CM | POA: Diagnosis not present

## 2014-10-02 DIAGNOSIS — M791 Myalgia: Secondary | ICD-10-CM | POA: Diagnosis not present

## 2014-10-02 DIAGNOSIS — K219 Gastro-esophageal reflux disease without esophagitis: Secondary | ICD-10-CM | POA: Diagnosis not present

## 2014-10-02 LAB — HEPATIC FUNCTION PANEL
ALT: 16 U/L (ref 0–35)
AST: 21 U/L (ref 0–37)
Albumin: 4 g/dL (ref 3.5–5.2)
Alkaline Phosphatase: 98 U/L (ref 39–117)
BILIRUBIN DIRECT: 0.1 mg/dL (ref 0.0–0.3)
BILIRUBIN TOTAL: 0.4 mg/dL (ref 0.2–1.2)
Indirect Bilirubin: 0.3 mg/dL (ref 0.2–1.2)
Total Protein: 6.9 g/dL (ref 6.0–8.3)

## 2014-10-02 NOTE — Progress Notes (Signed)
cc'ed to pcp °

## 2014-10-07 ENCOUNTER — Ambulatory Visit: Payer: Medicare Other | Admitting: Gastroenterology

## 2014-10-14 DIAGNOSIS — M0579 Rheumatoid arthritis with rheumatoid factor of multiple sites without organ or systems involvement: Secondary | ICD-10-CM | POA: Diagnosis not present

## 2014-10-21 ENCOUNTER — Telehealth: Payer: Self-pay | Admitting: Gastroenterology

## 2014-10-21 NOTE — Telephone Encounter (Signed)
PLEASE CALL PT. HER LIVER ENZYMES ARE NORMAL.

## 2014-10-22 NOTE — Telephone Encounter (Signed)
LMOM blood work normal and call if questions.

## 2014-10-26 ENCOUNTER — Ambulatory Visit (HOSPITAL_COMMUNITY)
Admission: RE | Admit: 2014-10-26 | Discharge: 2014-10-26 | Disposition: A | Discharge status: Home / Self Care | Payer: Medicare Other | Source: Ambulatory Visit | Attending: Gastroenterology | Admitting: Gastroenterology

## 2014-10-26 DIAGNOSIS — K449 Diaphragmatic hernia without obstruction or gangrene: Secondary | ICD-10-CM | POA: Insufficient documentation

## 2014-10-26 DIAGNOSIS — K224 Dyskinesia of esophagus: Secondary | ICD-10-CM | POA: Insufficient documentation

## 2014-10-26 DIAGNOSIS — R1314 Dysphagia, pharyngoesophageal phase: Secondary | ICD-10-CM | POA: Diagnosis not present

## 2014-11-03 NOTE — Telephone Encounter (Signed)
PLEASE CALL PT. HER BARIUM SWALLOW SHOWED A WEAK ESOPHAGUS BUT NO STRICTURE OR BLOCKAGE. IF SHE IS HAVING TROUBLE SWALLOWING IT IS DUE TO HAVING A WEAK ESOPHAGUS WHICH OCCURS AS SOME PEOPLE AGE. SHE SHOULD CONTINUE TO FOLLOW A SOFT MECHANICAL DIET.  MEATS SHOULD BE CHOPPED OR GROUND ONLY. DO NOT EAT CHUNKS OF ANYTHING.

## 2014-11-03 NOTE — Telephone Encounter (Signed)
LMOM to call.

## 2014-11-04 NOTE — Telephone Encounter (Signed)
Pt is aware of results. 

## 2014-11-10 DIAGNOSIS — M0579 Rheumatoid arthritis with rheumatoid factor of multiple sites without organ or systems involvement: Secondary | ICD-10-CM | POA: Diagnosis not present

## 2014-11-10 DIAGNOSIS — Z79899 Other long term (current) drug therapy: Secondary | ICD-10-CM | POA: Diagnosis not present

## 2014-12-07 DIAGNOSIS — Z79899 Other long term (current) drug therapy: Secondary | ICD-10-CM | POA: Diagnosis not present

## 2014-12-07 DIAGNOSIS — M0579 Rheumatoid arthritis with rheumatoid factor of multiple sites without organ or systems involvement: Secondary | ICD-10-CM | POA: Diagnosis not present

## 2014-12-08 DIAGNOSIS — I10 Essential (primary) hypertension: Secondary | ICD-10-CM | POA: Diagnosis not present

## 2014-12-08 DIAGNOSIS — K219 Gastro-esophageal reflux disease without esophagitis: Secondary | ICD-10-CM | POA: Diagnosis not present

## 2014-12-08 DIAGNOSIS — I872 Venous insufficiency (chronic) (peripheral): Secondary | ICD-10-CM | POA: Diagnosis not present

## 2014-12-08 DIAGNOSIS — M069 Rheumatoid arthritis, unspecified: Secondary | ICD-10-CM | POA: Diagnosis not present

## 2014-12-09 DIAGNOSIS — T50995D Adverse effect of other drugs, medicaments and biological substances, subsequent encounter: Secondary | ICD-10-CM | POA: Diagnosis not present

## 2014-12-09 DIAGNOSIS — E242 Drug-induced Cushing's syndrome: Secondary | ICD-10-CM | POA: Diagnosis not present

## 2014-12-09 DIAGNOSIS — E119 Type 2 diabetes mellitus without complications: Secondary | ICD-10-CM | POA: Diagnosis not present

## 2014-12-09 DIAGNOSIS — E273 Drug-induced adrenocortical insufficiency: Secondary | ICD-10-CM | POA: Diagnosis not present

## 2014-12-09 DIAGNOSIS — Z79899 Other long term (current) drug therapy: Secondary | ICD-10-CM | POA: Diagnosis not present

## 2015-01-05 DIAGNOSIS — Z79899 Other long term (current) drug therapy: Secondary | ICD-10-CM | POA: Diagnosis not present

## 2015-01-05 DIAGNOSIS — E2740 Unspecified adrenocortical insufficiency: Secondary | ICD-10-CM | POA: Diagnosis not present

## 2015-01-05 DIAGNOSIS — R300 Dysuria: Secondary | ICD-10-CM | POA: Diagnosis not present

## 2015-01-05 DIAGNOSIS — M797 Fibromyalgia: Secondary | ICD-10-CM | POA: Diagnosis not present

## 2015-01-05 DIAGNOSIS — M0579 Rheumatoid arthritis with rheumatoid factor of multiple sites without organ or systems involvement: Secondary | ICD-10-CM | POA: Diagnosis not present

## 2015-01-05 DIAGNOSIS — E119 Type 2 diabetes mellitus without complications: Secondary | ICD-10-CM | POA: Diagnosis not present

## 2015-01-05 DIAGNOSIS — R42 Dizziness and giddiness: Secondary | ICD-10-CM | POA: Diagnosis not present

## 2015-01-05 DIAGNOSIS — M25552 Pain in left hip: Secondary | ICD-10-CM | POA: Diagnosis not present

## 2015-01-11 ENCOUNTER — Other Ambulatory Visit: Payer: Self-pay

## 2015-02-02 DIAGNOSIS — M0579 Rheumatoid arthritis with rheumatoid factor of multiple sites without organ or systems involvement: Secondary | ICD-10-CM | POA: Diagnosis not present

## 2015-02-02 DIAGNOSIS — R42 Dizziness and giddiness: Secondary | ICD-10-CM | POA: Diagnosis not present

## 2015-02-02 DIAGNOSIS — H903 Sensorineural hearing loss, bilateral: Secondary | ICD-10-CM | POA: Diagnosis not present

## 2015-02-15 ENCOUNTER — Encounter: Payer: Self-pay | Admitting: Gastroenterology

## 2015-03-02 DIAGNOSIS — M0579 Rheumatoid arthritis with rheumatoid factor of multiple sites without organ or systems involvement: Secondary | ICD-10-CM | POA: Diagnosis not present

## 2015-03-02 DIAGNOSIS — M0519 Rheumatoid lung disease with rheumatoid arthritis of multiple sites: Secondary | ICD-10-CM | POA: Diagnosis not present

## 2015-03-02 DIAGNOSIS — Z79899 Other long term (current) drug therapy: Secondary | ICD-10-CM | POA: Diagnosis not present

## 2015-03-16 DIAGNOSIS — E273 Drug-induced adrenocortical insufficiency: Secondary | ICD-10-CM | POA: Diagnosis not present

## 2015-03-26 DIAGNOSIS — Z79899 Other long term (current) drug therapy: Secondary | ICD-10-CM | POA: Diagnosis not present

## 2015-03-26 DIAGNOSIS — M0579 Rheumatoid arthritis with rheumatoid factor of multiple sites without organ or systems involvement: Secondary | ICD-10-CM | POA: Diagnosis not present

## 2015-03-31 DIAGNOSIS — Z23 Encounter for immunization: Secondary | ICD-10-CM | POA: Diagnosis not present

## 2015-03-31 DIAGNOSIS — G8929 Other chronic pain: Secondary | ICD-10-CM | POA: Diagnosis not present

## 2015-03-31 DIAGNOSIS — I10 Essential (primary) hypertension: Secondary | ICD-10-CM | POA: Diagnosis not present

## 2015-03-31 DIAGNOSIS — I872 Venous insufficiency (chronic) (peripheral): Secondary | ICD-10-CM | POA: Diagnosis not present

## 2015-03-31 DIAGNOSIS — M069 Rheumatoid arthritis, unspecified: Secondary | ICD-10-CM | POA: Diagnosis not present

## 2015-04-20 DIAGNOSIS — N390 Urinary tract infection, site not specified: Secondary | ICD-10-CM | POA: Diagnosis not present

## 2015-04-27 ENCOUNTER — Encounter: Payer: Self-pay | Admitting: Gastroenterology

## 2015-05-04 DIAGNOSIS — Z23 Encounter for immunization: Secondary | ICD-10-CM | POA: Diagnosis not present

## 2015-05-14 DIAGNOSIS — M7061 Trochanteric bursitis, right hip: Secondary | ICD-10-CM | POA: Diagnosis not present

## 2015-06-23 DIAGNOSIS — M1611 Unilateral primary osteoarthritis, right hip: Secondary | ICD-10-CM | POA: Diagnosis not present

## 2015-06-29 ENCOUNTER — Other Ambulatory Visit: Payer: Self-pay | Admitting: Gastroenterology

## 2015-06-30 DIAGNOSIS — M069 Rheumatoid arthritis, unspecified: Secondary | ICD-10-CM | POA: Diagnosis not present

## 2015-06-30 DIAGNOSIS — G8929 Other chronic pain: Secondary | ICD-10-CM | POA: Diagnosis not present

## 2015-06-30 DIAGNOSIS — I10 Essential (primary) hypertension: Secondary | ICD-10-CM | POA: Diagnosis not present

## 2015-06-30 DIAGNOSIS — R5383 Other fatigue: Secondary | ICD-10-CM | POA: Diagnosis not present

## 2015-07-02 DIAGNOSIS — Z79899 Other long term (current) drug therapy: Secondary | ICD-10-CM | POA: Diagnosis not present

## 2015-07-02 DIAGNOSIS — M542 Cervicalgia: Secondary | ICD-10-CM | POA: Diagnosis not present

## 2015-07-02 DIAGNOSIS — M0579 Rheumatoid arthritis with rheumatoid factor of multiple sites without organ or systems involvement: Secondary | ICD-10-CM | POA: Diagnosis not present

## 2015-07-06 DIAGNOSIS — R300 Dysuria: Secondary | ICD-10-CM | POA: Diagnosis not present

## 2015-07-29 ENCOUNTER — Other Ambulatory Visit: Payer: Self-pay | Admitting: Gastroenterology

## 2015-07-29 DIAGNOSIS — Z79899 Other long term (current) drug therapy: Secondary | ICD-10-CM | POA: Diagnosis not present

## 2015-08-04 DIAGNOSIS — N281 Cyst of kidney, acquired: Secondary | ICD-10-CM | POA: Diagnosis not present

## 2015-08-04 DIAGNOSIS — N390 Urinary tract infection, site not specified: Secondary | ICD-10-CM | POA: Diagnosis not present

## 2015-08-04 DIAGNOSIS — Q6102 Congenital multiple renal cysts: Secondary | ICD-10-CM | POA: Diagnosis not present

## 2015-09-21 ENCOUNTER — Other Ambulatory Visit: Payer: Self-pay | Admitting: Gastroenterology

## 2015-09-21 ENCOUNTER — Other Ambulatory Visit: Payer: Self-pay | Admitting: "Endocrinology

## 2015-10-05 DIAGNOSIS — Z79899 Other long term (current) drug therapy: Secondary | ICD-10-CM | POA: Diagnosis not present

## 2015-10-05 DIAGNOSIS — M0579 Rheumatoid arthritis with rheumatoid factor of multiple sites without organ or systems involvement: Secondary | ICD-10-CM | POA: Diagnosis not present

## 2015-10-07 DIAGNOSIS — M791 Myalgia: Secondary | ICD-10-CM | POA: Diagnosis not present

## 2015-10-07 DIAGNOSIS — M069 Rheumatoid arthritis, unspecified: Secondary | ICD-10-CM | POA: Diagnosis not present

## 2015-10-07 DIAGNOSIS — N390 Urinary tract infection, site not specified: Secondary | ICD-10-CM | POA: Diagnosis not present

## 2015-10-07 DIAGNOSIS — G8929 Other chronic pain: Secondary | ICD-10-CM | POA: Diagnosis not present

## 2015-10-07 DIAGNOSIS — I10 Essential (primary) hypertension: Secondary | ICD-10-CM | POA: Diagnosis not present

## 2015-10-08 ENCOUNTER — Ambulatory Visit (HOSPITAL_COMMUNITY)
Admission: RE | Admit: 2015-10-08 | Discharge: 2015-10-08 | Disposition: A | Discharge status: Home / Self Care | Payer: Medicare Other | Source: Ambulatory Visit | Attending: Pulmonary Disease | Admitting: Pulmonary Disease

## 2015-10-08 ENCOUNTER — Other Ambulatory Visit (HOSPITAL_COMMUNITY): Payer: Self-pay | Admitting: Pulmonary Disease

## 2015-10-08 DIAGNOSIS — I10 Essential (primary) hypertension: Secondary | ICD-10-CM | POA: Diagnosis not present

## 2015-10-08 DIAGNOSIS — G8929 Other chronic pain: Secondary | ICD-10-CM | POA: Diagnosis not present

## 2015-10-08 DIAGNOSIS — M069 Rheumatoid arthritis, unspecified: Secondary | ICD-10-CM | POA: Diagnosis not present

## 2015-10-08 DIAGNOSIS — M791 Myalgia: Secondary | ICD-10-CM | POA: Diagnosis not present

## 2015-10-08 DIAGNOSIS — R7982 Elevated C-reactive protein (CRP): Secondary | ICD-10-CM | POA: Diagnosis not present

## 2015-10-08 DIAGNOSIS — R079 Chest pain, unspecified: Secondary | ICD-10-CM | POA: Diagnosis not present

## 2015-10-15 ENCOUNTER — Telehealth: Payer: Self-pay

## 2015-10-15 NOTE — Telephone Encounter (Signed)
Pt left VM that she needed an updated prescription for the Linzess. I returned call and left Vm that a new prescription was sent in Jan 2017 with 11 refills. I asked her to tell the pharmacy to check their records and to call me if there is a problem.

## 2015-10-19 DIAGNOSIS — M791 Myalgia: Secondary | ICD-10-CM | POA: Diagnosis not present

## 2015-10-19 DIAGNOSIS — G894 Chronic pain syndrome: Secondary | ICD-10-CM | POA: Diagnosis not present

## 2015-10-19 DIAGNOSIS — M461 Sacroiliitis, not elsewhere classified: Secondary | ICD-10-CM | POA: Diagnosis not present

## 2015-10-19 DIAGNOSIS — M47896 Other spondylosis, lumbar region: Secondary | ICD-10-CM | POA: Diagnosis not present

## 2015-10-19 DIAGNOSIS — M25559 Pain in unspecified hip: Secondary | ICD-10-CM | POA: Diagnosis not present

## 2015-10-21 DIAGNOSIS — S6981XA Other specified injuries of right wrist, hand and finger(s), initial encounter: Secondary | ICD-10-CM | POA: Diagnosis not present

## 2015-10-21 DIAGNOSIS — I10 Essential (primary) hypertension: Secondary | ICD-10-CM | POA: Diagnosis not present

## 2015-10-21 DIAGNOSIS — M069 Rheumatoid arthritis, unspecified: Secondary | ICD-10-CM | POA: Diagnosis not present

## 2015-10-21 DIAGNOSIS — M24231 Disorder of ligament, right wrist: Secondary | ICD-10-CM | POA: Diagnosis not present

## 2015-10-21 DIAGNOSIS — Z886 Allergy status to analgesic agent status: Secondary | ICD-10-CM | POA: Diagnosis not present

## 2015-10-21 DIAGNOSIS — Z90710 Acquired absence of both cervix and uterus: Secondary | ICD-10-CM | POA: Diagnosis not present

## 2015-10-21 DIAGNOSIS — M25511 Pain in right shoulder: Secondary | ICD-10-CM | POA: Diagnosis not present

## 2015-10-21 DIAGNOSIS — W010XXA Fall on same level from slipping, tripping and stumbling without subsequent striking against object, initial encounter: Secondary | ICD-10-CM | POA: Diagnosis not present

## 2015-10-21 DIAGNOSIS — M25531 Pain in right wrist: Secondary | ICD-10-CM | POA: Diagnosis not present

## 2015-10-27 DIAGNOSIS — M533 Sacrococcygeal disorders, not elsewhere classified: Secondary | ICD-10-CM | POA: Diagnosis not present

## 2015-10-28 DIAGNOSIS — M47896 Other spondylosis, lumbar region: Secondary | ICD-10-CM | POA: Diagnosis not present

## 2015-11-02 DIAGNOSIS — G894 Chronic pain syndrome: Secondary | ICD-10-CM | POA: Diagnosis not present

## 2015-11-02 DIAGNOSIS — M461 Sacroiliitis, not elsewhere classified: Secondary | ICD-10-CM | POA: Diagnosis not present

## 2015-11-02 DIAGNOSIS — M25559 Pain in unspecified hip: Secondary | ICD-10-CM | POA: Diagnosis not present

## 2015-11-02 DIAGNOSIS — N390 Urinary tract infection, site not specified: Secondary | ICD-10-CM | POA: Diagnosis not present

## 2015-11-02 DIAGNOSIS — M791 Myalgia: Secondary | ICD-10-CM | POA: Diagnosis not present

## 2015-11-02 DIAGNOSIS — Z79899 Other long term (current) drug therapy: Secondary | ICD-10-CM | POA: Diagnosis not present

## 2015-11-02 DIAGNOSIS — M059 Rheumatoid arthritis with rheumatoid factor, unspecified: Secondary | ICD-10-CM | POA: Diagnosis not present

## 2015-11-02 DIAGNOSIS — M47896 Other spondylosis, lumbar region: Secondary | ICD-10-CM | POA: Diagnosis not present

## 2015-11-02 DIAGNOSIS — N281 Cyst of kidney, acquired: Secondary | ICD-10-CM | POA: Diagnosis not present

## 2015-11-04 DIAGNOSIS — Z1211 Encounter for screening for malignant neoplasm of colon: Secondary | ICD-10-CM | POA: Diagnosis not present

## 2015-12-24 DIAGNOSIS — M461 Sacroiliitis, not elsewhere classified: Secondary | ICD-10-CM | POA: Diagnosis not present

## 2015-12-24 DIAGNOSIS — M25551 Pain in right hip: Secondary | ICD-10-CM | POA: Diagnosis not present

## 2015-12-24 DIAGNOSIS — M47896 Other spondylosis, lumbar region: Secondary | ICD-10-CM | POA: Diagnosis not present

## 2015-12-24 DIAGNOSIS — M25559 Pain in unspecified hip: Secondary | ICD-10-CM | POA: Diagnosis not present

## 2015-12-24 DIAGNOSIS — M25552 Pain in left hip: Secondary | ICD-10-CM | POA: Diagnosis not present

## 2015-12-24 DIAGNOSIS — G894 Chronic pain syndrome: Secondary | ICD-10-CM | POA: Diagnosis not present

## 2015-12-24 DIAGNOSIS — M791 Myalgia: Secondary | ICD-10-CM | POA: Diagnosis not present

## 2016-01-12 DIAGNOSIS — M069 Rheumatoid arthritis, unspecified: Secondary | ICD-10-CM | POA: Diagnosis not present

## 2016-01-12 DIAGNOSIS — G8929 Other chronic pain: Secondary | ICD-10-CM | POA: Diagnosis not present

## 2016-01-12 DIAGNOSIS — F419 Anxiety disorder, unspecified: Secondary | ICD-10-CM | POA: Diagnosis not present

## 2016-01-12 DIAGNOSIS — I10 Essential (primary) hypertension: Secondary | ICD-10-CM | POA: Diagnosis not present

## 2016-01-21 DIAGNOSIS — M79671 Pain in right foot: Secondary | ICD-10-CM | POA: Diagnosis not present

## 2016-01-21 DIAGNOSIS — Z79899 Other long term (current) drug therapy: Secondary | ICD-10-CM | POA: Diagnosis not present

## 2016-01-21 DIAGNOSIS — M0579 Rheumatoid arthritis with rheumatoid factor of multiple sites without organ or systems involvement: Secondary | ICD-10-CM | POA: Diagnosis not present

## 2016-01-21 DIAGNOSIS — S9031XA Contusion of right foot, initial encounter: Secondary | ICD-10-CM | POA: Diagnosis not present

## 2016-02-07 DIAGNOSIS — Z79899 Other long term (current) drug therapy: Secondary | ICD-10-CM | POA: Diagnosis not present

## 2016-02-07 DIAGNOSIS — M059 Rheumatoid arthritis with rheumatoid factor, unspecified: Secondary | ICD-10-CM | POA: Diagnosis not present

## 2016-02-07 DIAGNOSIS — M791 Myalgia: Secondary | ICD-10-CM | POA: Diagnosis not present

## 2016-02-07 DIAGNOSIS — R300 Dysuria: Secondary | ICD-10-CM | POA: Diagnosis not present

## 2016-02-07 DIAGNOSIS — G894 Chronic pain syndrome: Secondary | ICD-10-CM | POA: Diagnosis not present

## 2016-02-07 DIAGNOSIS — M47896 Other spondylosis, lumbar region: Secondary | ICD-10-CM | POA: Diagnosis not present

## 2016-02-07 DIAGNOSIS — M25559 Pain in unspecified hip: Secondary | ICD-10-CM | POA: Diagnosis not present

## 2016-02-07 DIAGNOSIS — M461 Sacroiliitis, not elsewhere classified: Secondary | ICD-10-CM | POA: Diagnosis not present

## 2016-03-06 DIAGNOSIS — R339 Retention of urine, unspecified: Secondary | ICD-10-CM | POA: Diagnosis not present

## 2016-03-14 ENCOUNTER — Other Ambulatory Visit: Payer: Self-pay

## 2016-03-19 DIAGNOSIS — R300 Dysuria: Secondary | ICD-10-CM | POA: Diagnosis not present

## 2016-03-19 DIAGNOSIS — R509 Fever, unspecified: Secondary | ICD-10-CM | POA: Diagnosis not present

## 2016-03-19 DIAGNOSIS — M549 Dorsalgia, unspecified: Secondary | ICD-10-CM | POA: Diagnosis not present

## 2016-03-19 DIAGNOSIS — R109 Unspecified abdominal pain: Secondary | ICD-10-CM | POA: Diagnosis not present

## 2016-03-19 DIAGNOSIS — Z5181 Encounter for therapeutic drug level monitoring: Secondary | ICD-10-CM | POA: Diagnosis not present

## 2016-03-19 DIAGNOSIS — R110 Nausea: Secondary | ICD-10-CM | POA: Diagnosis not present

## 2016-03-19 DIAGNOSIS — L03313 Cellulitis of chest wall: Secondary | ICD-10-CM | POA: Diagnosis not present

## 2016-03-19 DIAGNOSIS — R32 Unspecified urinary incontinence: Secondary | ICD-10-CM | POA: Diagnosis not present

## 2016-03-19 DIAGNOSIS — R5381 Other malaise: Secondary | ICD-10-CM | POA: Diagnosis not present

## 2016-03-19 DIAGNOSIS — Z87440 Personal history of urinary (tract) infections: Secondary | ICD-10-CM | POA: Diagnosis not present

## 2016-03-19 DIAGNOSIS — R21 Rash and other nonspecific skin eruption: Secondary | ICD-10-CM | POA: Diagnosis not present

## 2016-03-19 DIAGNOSIS — Z79899 Other long term (current) drug therapy: Secondary | ICD-10-CM | POA: Diagnosis not present

## 2016-03-27 DIAGNOSIS — M461 Sacroiliitis, not elsewhere classified: Secondary | ICD-10-CM | POA: Diagnosis not present

## 2016-03-27 DIAGNOSIS — M47896 Other spondylosis, lumbar region: Secondary | ICD-10-CM | POA: Diagnosis not present

## 2016-03-27 DIAGNOSIS — G894 Chronic pain syndrome: Secondary | ICD-10-CM | POA: Diagnosis not present

## 2016-03-27 DIAGNOSIS — M179 Osteoarthritis of knee, unspecified: Secondary | ICD-10-CM | POA: Diagnosis not present

## 2016-03-27 DIAGNOSIS — M791 Myalgia: Secondary | ICD-10-CM | POA: Diagnosis not present

## 2016-03-27 DIAGNOSIS — M25559 Pain in unspecified hip: Secondary | ICD-10-CM | POA: Diagnosis not present

## 2016-03-29 ENCOUNTER — Other Ambulatory Visit: Payer: Self-pay | Admitting: Gastroenterology

## 2016-04-03 DIAGNOSIS — Z79899 Other long term (current) drug therapy: Secondary | ICD-10-CM | POA: Diagnosis not present

## 2016-04-04 DIAGNOSIS — Z79899 Other long term (current) drug therapy: Secondary | ICD-10-CM | POA: Diagnosis not present

## 2016-04-04 DIAGNOSIS — T148 Other injury of unspecified body region: Secondary | ICD-10-CM | POA: Diagnosis not present

## 2016-04-07 DIAGNOSIS — R109 Unspecified abdominal pain: Secondary | ICD-10-CM | POA: Diagnosis not present

## 2016-04-07 DIAGNOSIS — M069 Rheumatoid arthritis, unspecified: Secondary | ICD-10-CM | POA: Diagnosis not present

## 2016-04-07 DIAGNOSIS — N281 Cyst of kidney, acquired: Secondary | ICD-10-CM | POA: Diagnosis not present

## 2016-04-07 DIAGNOSIS — N390 Urinary tract infection, site not specified: Secondary | ICD-10-CM | POA: Diagnosis not present

## 2016-04-07 DIAGNOSIS — Z87440 Personal history of urinary (tract) infections: Secondary | ICD-10-CM | POA: Diagnosis not present

## 2016-04-07 DIAGNOSIS — N200 Calculus of kidney: Secondary | ICD-10-CM | POA: Diagnosis not present

## 2016-04-07 DIAGNOSIS — N12 Tubulo-interstitial nephritis, not specified as acute or chronic: Secondary | ICD-10-CM | POA: Diagnosis not present

## 2016-04-21 DIAGNOSIS — M0579 Rheumatoid arthritis with rheumatoid factor of multiple sites without organ or systems involvement: Secondary | ICD-10-CM | POA: Diagnosis not present

## 2016-04-21 DIAGNOSIS — Z79899 Other long term (current) drug therapy: Secondary | ICD-10-CM | POA: Diagnosis not present

## 2016-05-02 DIAGNOSIS — S299XXA Unspecified injury of thorax, initial encounter: Secondary | ICD-10-CM | POA: Diagnosis not present

## 2016-05-02 DIAGNOSIS — S199XXA Unspecified injury of neck, initial encounter: Secondary | ICD-10-CM | POA: Diagnosis not present

## 2016-05-02 DIAGNOSIS — M129 Arthropathy, unspecified: Secondary | ICD-10-CM | POA: Diagnosis not present

## 2016-05-02 DIAGNOSIS — Z90710 Acquired absence of both cervix and uterus: Secondary | ICD-10-CM | POA: Diagnosis not present

## 2016-05-02 DIAGNOSIS — S42202A Unspecified fracture of upper end of left humerus, initial encounter for closed fracture: Secondary | ICD-10-CM | POA: Diagnosis not present

## 2016-05-02 DIAGNOSIS — M5030 Other cervical disc degeneration, unspecified cervical region: Secondary | ICD-10-CM | POA: Diagnosis not present

## 2016-05-02 DIAGNOSIS — S42252A Displaced fracture of greater tuberosity of left humerus, initial encounter for closed fracture: Secondary | ICD-10-CM | POA: Diagnosis not present

## 2016-05-02 DIAGNOSIS — S43102A Unspecified dislocation of left acromioclavicular joint, initial encounter: Secondary | ICD-10-CM | POA: Diagnosis not present

## 2016-05-02 DIAGNOSIS — Z882 Allergy status to sulfonamides status: Secondary | ICD-10-CM | POA: Diagnosis not present

## 2016-05-02 DIAGNOSIS — I10 Essential (primary) hypertension: Secondary | ICD-10-CM | POA: Diagnosis not present

## 2016-05-02 DIAGNOSIS — S42212A Unspecified displaced fracture of surgical neck of left humerus, initial encounter for closed fracture: Secondary | ICD-10-CM | POA: Diagnosis not present

## 2016-05-02 DIAGNOSIS — Z9181 History of falling: Secondary | ICD-10-CM | POA: Diagnosis not present

## 2016-05-02 DIAGNOSIS — S59912A Unspecified injury of left forearm, initial encounter: Secondary | ICD-10-CM | POA: Diagnosis not present

## 2016-05-02 DIAGNOSIS — S6992XA Unspecified injury of left wrist, hand and finger(s), initial encounter: Secondary | ICD-10-CM | POA: Diagnosis not present

## 2016-05-02 DIAGNOSIS — S0990XA Unspecified injury of head, initial encounter: Secondary | ICD-10-CM | POA: Diagnosis not present

## 2016-05-02 DIAGNOSIS — Z79899 Other long term (current) drug therapy: Secondary | ICD-10-CM | POA: Diagnosis not present

## 2016-05-04 DIAGNOSIS — S42202A Unspecified fracture of upper end of left humerus, initial encounter for closed fracture: Secondary | ICD-10-CM | POA: Diagnosis not present

## 2016-05-04 DIAGNOSIS — S2232XA Fracture of one rib, left side, initial encounter for closed fracture: Secondary | ICD-10-CM | POA: Diagnosis not present

## 2016-05-04 DIAGNOSIS — S42292A Other displaced fracture of upper end of left humerus, initial encounter for closed fracture: Secondary | ICD-10-CM | POA: Diagnosis not present

## 2016-05-04 DIAGNOSIS — Y33XXXA Other specified events, undetermined intent, initial encounter: Secondary | ICD-10-CM | POA: Diagnosis not present

## 2016-05-04 DIAGNOSIS — S42212A Unspecified displaced fracture of surgical neck of left humerus, initial encounter for closed fracture: Secondary | ICD-10-CM | POA: Diagnosis not present

## 2016-05-05 DIAGNOSIS — S42202A Unspecified fracture of upper end of left humerus, initial encounter for closed fracture: Secondary | ICD-10-CM | POA: Diagnosis not present

## 2016-05-05 DIAGNOSIS — Y33XXXA Other specified events, undetermined intent, initial encounter: Secondary | ICD-10-CM | POA: Diagnosis not present

## 2016-05-05 DIAGNOSIS — S2232XA Fracture of one rib, left side, initial encounter for closed fracture: Secondary | ICD-10-CM | POA: Diagnosis not present

## 2016-05-08 DIAGNOSIS — D649 Anemia, unspecified: Secondary | ICD-10-CM | POA: Diagnosis not present

## 2016-05-08 DIAGNOSIS — M0579 Rheumatoid arthritis with rheumatoid factor of multiple sites without organ or systems involvement: Secondary | ICD-10-CM | POA: Diagnosis not present

## 2016-05-08 DIAGNOSIS — M25512 Pain in left shoulder: Secondary | ICD-10-CM | POA: Diagnosis not present

## 2016-05-08 DIAGNOSIS — E559 Vitamin D deficiency, unspecified: Secondary | ICD-10-CM | POA: Diagnosis not present

## 2016-05-08 DIAGNOSIS — Z79899 Other long term (current) drug therapy: Secondary | ICD-10-CM | POA: Diagnosis not present

## 2016-05-22 DIAGNOSIS — M461 Sacroiliitis, not elsewhere classified: Secondary | ICD-10-CM | POA: Diagnosis not present

## 2016-05-22 DIAGNOSIS — G894 Chronic pain syndrome: Secondary | ICD-10-CM | POA: Diagnosis not present

## 2016-05-22 DIAGNOSIS — M791 Myalgia: Secondary | ICD-10-CM | POA: Diagnosis not present

## 2016-05-22 DIAGNOSIS — M069 Rheumatoid arthritis, unspecified: Secondary | ICD-10-CM | POA: Diagnosis not present

## 2016-05-22 DIAGNOSIS — M25512 Pain in left shoulder: Secondary | ICD-10-CM | POA: Diagnosis not present

## 2016-05-22 DIAGNOSIS — M25551 Pain in right hip: Secondary | ICD-10-CM | POA: Diagnosis not present

## 2016-05-22 DIAGNOSIS — M25552 Pain in left hip: Secondary | ICD-10-CM | POA: Diagnosis not present

## 2016-05-22 DIAGNOSIS — M25519 Pain in unspecified shoulder: Secondary | ICD-10-CM | POA: Diagnosis not present

## 2016-05-22 DIAGNOSIS — M47896 Other spondylosis, lumbar region: Secondary | ICD-10-CM | POA: Diagnosis not present

## 2016-05-22 DIAGNOSIS — M25532 Pain in left wrist: Secondary | ICD-10-CM | POA: Diagnosis not present

## 2016-05-22 DIAGNOSIS — M79632 Pain in left forearm: Secondary | ICD-10-CM | POA: Diagnosis not present

## 2016-05-22 DIAGNOSIS — M179 Osteoarthritis of knee, unspecified: Secondary | ICD-10-CM | POA: Diagnosis not present

## 2016-05-22 DIAGNOSIS — M79602 Pain in left arm: Secondary | ICD-10-CM | POA: Diagnosis not present

## 2016-05-31 DIAGNOSIS — S42251D Displaced fracture of greater tuberosity of right humerus, subsequent encounter for fracture with routine healing: Secondary | ICD-10-CM | POA: Diagnosis not present

## 2016-06-16 DIAGNOSIS — S52502A Unspecified fracture of the lower end of left radius, initial encounter for closed fracture: Secondary | ICD-10-CM | POA: Diagnosis not present

## 2016-06-16 DIAGNOSIS — I10 Essential (primary) hypertension: Secondary | ICD-10-CM | POA: Diagnosis not present

## 2016-06-16 DIAGNOSIS — S42292A Other displaced fracture of upper end of left humerus, initial encounter for closed fracture: Secondary | ICD-10-CM | POA: Diagnosis not present

## 2016-06-16 DIAGNOSIS — S52202A Unspecified fracture of shaft of left ulna, initial encounter for closed fracture: Secondary | ICD-10-CM | POA: Diagnosis not present

## 2016-06-16 DIAGNOSIS — S4992XA Unspecified injury of left shoulder and upper arm, initial encounter: Secondary | ICD-10-CM | POA: Diagnosis not present

## 2016-06-16 DIAGNOSIS — M069 Rheumatoid arthritis, unspecified: Secondary | ICD-10-CM | POA: Diagnosis not present

## 2016-06-16 DIAGNOSIS — Z882 Allergy status to sulfonamides status: Secondary | ICD-10-CM | POA: Diagnosis not present

## 2016-06-16 DIAGNOSIS — Z79899 Other long term (current) drug therapy: Secondary | ICD-10-CM | POA: Diagnosis not present

## 2016-06-16 DIAGNOSIS — S52222A Displaced transverse fracture of shaft of left ulna, initial encounter for closed fracture: Secondary | ICD-10-CM | POA: Diagnosis not present

## 2016-06-29 DIAGNOSIS — S52222A Displaced transverse fracture of shaft of left ulna, initial encounter for closed fracture: Secondary | ICD-10-CM | POA: Diagnosis not present

## 2016-06-29 DIAGNOSIS — S42251D Displaced fracture of greater tuberosity of right humerus, subsequent encounter for fracture with routine healing: Secondary | ICD-10-CM | POA: Diagnosis not present

## 2016-06-29 DIAGNOSIS — M810 Age-related osteoporosis without current pathological fracture: Secondary | ICD-10-CM | POA: Diagnosis not present

## 2016-07-05 DIAGNOSIS — F0781 Postconcussional syndrome: Secondary | ICD-10-CM | POA: Diagnosis not present

## 2016-07-05 DIAGNOSIS — S060X0A Concussion without loss of consciousness, initial encounter: Secondary | ICD-10-CM | POA: Diagnosis not present

## 2016-07-12 DIAGNOSIS — M25512 Pain in left shoulder: Secondary | ICD-10-CM | POA: Diagnosis not present

## 2016-07-12 DIAGNOSIS — S060X0D Concussion without loss of consciousness, subsequent encounter: Secondary | ICD-10-CM | POA: Diagnosis not present

## 2016-07-19 DIAGNOSIS — S42302D Unspecified fracture of shaft of humerus, left arm, subsequent encounter for fracture with routine healing: Secondary | ICD-10-CM | POA: Diagnosis not present

## 2016-07-19 DIAGNOSIS — S060X0A Concussion without loss of consciousness, initial encounter: Secondary | ICD-10-CM | POA: Diagnosis not present

## 2016-07-19 DIAGNOSIS — M069 Rheumatoid arthritis, unspecified: Secondary | ICD-10-CM | POA: Diagnosis not present

## 2016-07-19 DIAGNOSIS — I10 Essential (primary) hypertension: Secondary | ICD-10-CM | POA: Diagnosis not present

## 2016-07-24 DIAGNOSIS — M25512 Pain in left shoulder: Secondary | ICD-10-CM | POA: Diagnosis not present

## 2016-07-24 DIAGNOSIS — S42252D Displaced fracture of greater tuberosity of left humerus, subsequent encounter for fracture with routine healing: Secondary | ICD-10-CM | POA: Diagnosis not present

## 2016-07-24 DIAGNOSIS — S52222A Displaced transverse fracture of shaft of left ulna, initial encounter for closed fracture: Secondary | ICD-10-CM | POA: Diagnosis not present

## 2016-07-24 DIAGNOSIS — S52222D Displaced transverse fracture of shaft of left ulna, subsequent encounter for closed fracture with routine healing: Secondary | ICD-10-CM | POA: Diagnosis not present

## 2016-07-31 DIAGNOSIS — M25552 Pain in left hip: Secondary | ICD-10-CM | POA: Diagnosis not present

## 2016-07-31 DIAGNOSIS — E559 Vitamin D deficiency, unspecified: Secondary | ICD-10-CM | POA: Diagnosis not present

## 2016-07-31 DIAGNOSIS — M25519 Pain in unspecified shoulder: Secondary | ICD-10-CM | POA: Diagnosis not present

## 2016-07-31 DIAGNOSIS — Z79899 Other long term (current) drug therapy: Secondary | ICD-10-CM | POA: Diagnosis not present

## 2016-07-31 DIAGNOSIS — G894 Chronic pain syndrome: Secondary | ICD-10-CM | POA: Diagnosis not present

## 2016-07-31 DIAGNOSIS — M791 Myalgia: Secondary | ICD-10-CM | POA: Diagnosis not present

## 2016-07-31 DIAGNOSIS — M0579 Rheumatoid arthritis with rheumatoid factor of multiple sites without organ or systems involvement: Secondary | ICD-10-CM | POA: Diagnosis not present

## 2016-07-31 DIAGNOSIS — R5383 Other fatigue: Secondary | ICD-10-CM | POA: Diagnosis not present

## 2016-07-31 DIAGNOSIS — M25532 Pain in left wrist: Secondary | ICD-10-CM | POA: Diagnosis not present

## 2016-07-31 DIAGNOSIS — M179 Osteoarthritis of knee, unspecified: Secondary | ICD-10-CM | POA: Diagnosis not present

## 2016-07-31 DIAGNOSIS — M47896 Other spondylosis, lumbar region: Secondary | ICD-10-CM | POA: Diagnosis not present

## 2016-07-31 DIAGNOSIS — M461 Sacroiliitis, not elsewhere classified: Secondary | ICD-10-CM | POA: Diagnosis not present

## 2016-07-31 DIAGNOSIS — M25559 Pain in unspecified hip: Secondary | ICD-10-CM | POA: Diagnosis not present

## 2016-08-08 DIAGNOSIS — N3941 Urge incontinence: Secondary | ICD-10-CM | POA: Diagnosis not present

## 2016-08-08 DIAGNOSIS — N281 Cyst of kidney, acquired: Secondary | ICD-10-CM | POA: Diagnosis not present

## 2016-08-08 DIAGNOSIS — N12 Tubulo-interstitial nephritis, not specified as acute or chronic: Secondary | ICD-10-CM | POA: Diagnosis not present

## 2016-08-14 DIAGNOSIS — S52222D Displaced transverse fracture of shaft of left ulna, subsequent encounter for closed fracture with routine healing: Secondary | ICD-10-CM | POA: Diagnosis not present

## 2016-08-18 DIAGNOSIS — Z23 Encounter for immunization: Secondary | ICD-10-CM | POA: Diagnosis not present

## 2016-08-22 DIAGNOSIS — F0781 Postconcussional syndrome: Secondary | ICD-10-CM | POA: Diagnosis not present

## 2016-08-22 DIAGNOSIS — G8310 Monoplegia of lower limb affecting unspecified side: Secondary | ICD-10-CM | POA: Diagnosis not present

## 2016-08-22 DIAGNOSIS — I639 Cerebral infarction, unspecified: Secondary | ICD-10-CM | POA: Diagnosis not present

## 2016-08-22 DIAGNOSIS — M810 Age-related osteoporosis without current pathological fracture: Secondary | ICD-10-CM | POA: Diagnosis not present

## 2016-08-29 DIAGNOSIS — M25512 Pain in left shoulder: Secondary | ICD-10-CM | POA: Diagnosis not present

## 2016-09-02 DIAGNOSIS — G8310 Monoplegia of lower limb affecting unspecified side: Secondary | ICD-10-CM | POA: Diagnosis not present

## 2016-09-02 DIAGNOSIS — I639 Cerebral infarction, unspecified: Secondary | ICD-10-CM | POA: Diagnosis not present

## 2016-09-02 DIAGNOSIS — F0781 Postconcussional syndrome: Secondary | ICD-10-CM | POA: Diagnosis not present

## 2016-09-05 DIAGNOSIS — M810 Age-related osteoporosis without current pathological fracture: Secondary | ICD-10-CM | POA: Diagnosis not present

## 2016-09-05 DIAGNOSIS — E559 Vitamin D deficiency, unspecified: Secondary | ICD-10-CM | POA: Diagnosis not present

## 2016-09-06 DIAGNOSIS — M25512 Pain in left shoulder: Secondary | ICD-10-CM | POA: Diagnosis not present

## 2016-09-11 DIAGNOSIS — M791 Myalgia: Secondary | ICD-10-CM | POA: Diagnosis not present

## 2016-09-11 DIAGNOSIS — S52232K Displaced oblique fracture of shaft of left ulna, subsequent encounter for closed fracture with nonunion: Secondary | ICD-10-CM | POA: Diagnosis not present

## 2016-09-11 DIAGNOSIS — M25519 Pain in unspecified shoulder: Secondary | ICD-10-CM | POA: Diagnosis not present

## 2016-09-11 DIAGNOSIS — M25532 Pain in left wrist: Secondary | ICD-10-CM | POA: Diagnosis not present

## 2016-09-11 DIAGNOSIS — S52222A Displaced transverse fracture of shaft of left ulna, initial encounter for closed fracture: Secondary | ICD-10-CM | POA: Diagnosis not present

## 2016-09-11 DIAGNOSIS — M461 Sacroiliitis, not elsewhere classified: Secondary | ICD-10-CM | POA: Diagnosis not present

## 2016-09-11 DIAGNOSIS — G894 Chronic pain syndrome: Secondary | ICD-10-CM | POA: Diagnosis not present

## 2016-09-11 DIAGNOSIS — M25559 Pain in unspecified hip: Secondary | ICD-10-CM | POA: Diagnosis not present

## 2016-09-11 DIAGNOSIS — M179 Osteoarthritis of knee, unspecified: Secondary | ICD-10-CM | POA: Diagnosis not present

## 2016-09-11 DIAGNOSIS — M47896 Other spondylosis, lumbar region: Secondary | ICD-10-CM | POA: Diagnosis not present

## 2016-09-13 DIAGNOSIS — M25512 Pain in left shoulder: Secondary | ICD-10-CM | POA: Diagnosis not present

## 2016-09-21 DIAGNOSIS — S52232K Displaced oblique fracture of shaft of left ulna, subsequent encounter for closed fracture with nonunion: Secondary | ICD-10-CM | POA: Diagnosis not present

## 2016-09-21 DIAGNOSIS — M810 Age-related osteoporosis without current pathological fracture: Secondary | ICD-10-CM | POA: Diagnosis not present

## 2016-09-21 DIAGNOSIS — G4733 Obstructive sleep apnea (adult) (pediatric): Secondary | ICD-10-CM | POA: Diagnosis not present

## 2016-09-21 DIAGNOSIS — E2740 Unspecified adrenocortical insufficiency: Secondary | ICD-10-CM | POA: Diagnosis not present

## 2016-09-21 DIAGNOSIS — M797 Fibromyalgia: Secondary | ICD-10-CM | POA: Diagnosis not present

## 2016-09-21 DIAGNOSIS — K219 Gastro-esophageal reflux disease without esophagitis: Secondary | ICD-10-CM | POA: Diagnosis not present

## 2016-09-21 DIAGNOSIS — S52602K Unspecified fracture of lower end of left ulna, subsequent encounter for closed fracture with nonunion: Secondary | ICD-10-CM | POA: Diagnosis not present

## 2016-09-21 DIAGNOSIS — G8929 Other chronic pain: Secondary | ICD-10-CM | POA: Diagnosis not present

## 2016-09-21 DIAGNOSIS — I739 Peripheral vascular disease, unspecified: Secondary | ICD-10-CM | POA: Diagnosis not present

## 2016-09-21 DIAGNOSIS — Z8673 Personal history of transient ischemic attack (TIA), and cerebral infarction without residual deficits: Secondary | ICD-10-CM | POA: Diagnosis not present

## 2016-09-21 DIAGNOSIS — I10 Essential (primary) hypertension: Secondary | ICD-10-CM | POA: Diagnosis not present

## 2016-09-21 DIAGNOSIS — M069 Rheumatoid arthritis, unspecified: Secondary | ICD-10-CM | POA: Diagnosis not present

## 2016-09-21 DIAGNOSIS — G5622 Lesion of ulnar nerve, left upper limb: Secondary | ICD-10-CM | POA: Diagnosis not present

## 2016-09-28 DIAGNOSIS — M5136 Other intervertebral disc degeneration, lumbar region: Secondary | ICD-10-CM | POA: Diagnosis not present

## 2016-10-06 DIAGNOSIS — S52232K Displaced oblique fracture of shaft of left ulna, subsequent encounter for closed fracture with nonunion: Secondary | ICD-10-CM | POA: Diagnosis not present

## 2016-11-01 ENCOUNTER — Other Ambulatory Visit: Payer: Self-pay | Admitting: Gastroenterology

## 2016-11-03 DIAGNOSIS — Z09 Encounter for follow-up examination after completed treatment for conditions other than malignant neoplasm: Secondary | ICD-10-CM | POA: Diagnosis not present

## 2016-11-03 DIAGNOSIS — S52232K Displaced oblique fracture of shaft of left ulna, subsequent encounter for closed fracture with nonunion: Secondary | ICD-10-CM | POA: Diagnosis not present

## 2016-11-07 DIAGNOSIS — I639 Cerebral infarction, unspecified: Secondary | ICD-10-CM | POA: Diagnosis not present

## 2016-11-07 DIAGNOSIS — Z79899 Other long term (current) drug therapy: Secondary | ICD-10-CM | POA: Diagnosis not present

## 2016-11-07 DIAGNOSIS — M0579 Rheumatoid arthritis with rheumatoid factor of multiple sites without organ or systems involvement: Secondary | ICD-10-CM | POA: Diagnosis not present

## 2016-11-07 DIAGNOSIS — G8310 Monoplegia of lower limb affecting unspecified side: Secondary | ICD-10-CM | POA: Diagnosis not present

## 2016-11-07 DIAGNOSIS — F0781 Postconcussional syndrome: Secondary | ICD-10-CM | POA: Diagnosis not present

## 2016-11-09 DIAGNOSIS — M179 Osteoarthritis of knee, unspecified: Secondary | ICD-10-CM | POA: Diagnosis not present

## 2016-11-09 DIAGNOSIS — S22080D Wedge compression fracture of T11-T12 vertebra, subsequent encounter for fracture with routine healing: Secondary | ICD-10-CM | POA: Diagnosis not present

## 2016-11-09 DIAGNOSIS — M47896 Other spondylosis, lumbar region: Secondary | ICD-10-CM | POA: Diagnosis not present

## 2016-11-09 DIAGNOSIS — M791 Myalgia: Secondary | ICD-10-CM | POA: Diagnosis not present

## 2016-11-09 DIAGNOSIS — M25532 Pain in left wrist: Secondary | ICD-10-CM | POA: Diagnosis not present

## 2016-11-09 DIAGNOSIS — M25559 Pain in unspecified hip: Secondary | ICD-10-CM | POA: Diagnosis not present

## 2016-11-09 DIAGNOSIS — M25519 Pain in unspecified shoulder: Secondary | ICD-10-CM | POA: Diagnosis not present

## 2016-11-09 DIAGNOSIS — M461 Sacroiliitis, not elsewhere classified: Secondary | ICD-10-CM | POA: Diagnosis not present

## 2016-11-09 DIAGNOSIS — G894 Chronic pain syndrome: Secondary | ICD-10-CM | POA: Diagnosis not present

## 2016-11-17 DIAGNOSIS — M25552 Pain in left hip: Secondary | ICD-10-CM | POA: Diagnosis not present

## 2016-11-20 DIAGNOSIS — M25552 Pain in left hip: Secondary | ICD-10-CM | POA: Diagnosis not present

## 2016-11-28 DIAGNOSIS — R202 Paresthesia of skin: Secondary | ICD-10-CM | POA: Diagnosis not present

## 2016-11-28 DIAGNOSIS — G8310 Monoplegia of lower limb affecting unspecified side: Secondary | ICD-10-CM | POA: Diagnosis not present

## 2016-12-04 DIAGNOSIS — M79632 Pain in left forearm: Secondary | ICD-10-CM | POA: Diagnosis not present

## 2016-12-21 DIAGNOSIS — S42302D Unspecified fracture of shaft of humerus, left arm, subsequent encounter for fracture with routine healing: Secondary | ICD-10-CM | POA: Diagnosis not present

## 2016-12-21 DIAGNOSIS — I10 Essential (primary) hypertension: Secondary | ICD-10-CM | POA: Diagnosis not present

## 2016-12-21 DIAGNOSIS — M810 Age-related osteoporosis without current pathological fracture: Secondary | ICD-10-CM | POA: Diagnosis not present

## 2016-12-21 DIAGNOSIS — M069 Rheumatoid arthritis, unspecified: Secondary | ICD-10-CM | POA: Diagnosis not present

## 2017-01-30 DIAGNOSIS — M1990 Unspecified osteoarthritis, unspecified site: Secondary | ICD-10-CM | POA: Diagnosis not present

## 2017-01-30 DIAGNOSIS — G894 Chronic pain syndrome: Secondary | ICD-10-CM | POA: Diagnosis not present

## 2017-01-30 DIAGNOSIS — M47896 Other spondylosis, lumbar region: Secondary | ICD-10-CM | POA: Diagnosis not present

## 2017-01-30 DIAGNOSIS — M461 Sacroiliitis, not elsewhere classified: Secondary | ICD-10-CM | POA: Diagnosis not present

## 2017-01-30 DIAGNOSIS — M25559 Pain in unspecified hip: Secondary | ICD-10-CM | POA: Diagnosis not present

## 2017-01-30 DIAGNOSIS — M791 Myalgia: Secondary | ICD-10-CM | POA: Diagnosis not present

## 2017-01-30 DIAGNOSIS — M546 Pain in thoracic spine: Secondary | ICD-10-CM | POA: Diagnosis not present

## 2017-01-30 DIAGNOSIS — Z79899 Other long term (current) drug therapy: Secondary | ICD-10-CM | POA: Diagnosis not present

## 2017-01-30 DIAGNOSIS — M25532 Pain in left wrist: Secondary | ICD-10-CM | POA: Diagnosis not present

## 2017-01-30 DIAGNOSIS — M179 Osteoarthritis of knee, unspecified: Secondary | ICD-10-CM | POA: Diagnosis not present

## 2017-01-30 DIAGNOSIS — M0579 Rheumatoid arthritis with rheumatoid factor of multiple sites without organ or systems involvement: Secondary | ICD-10-CM | POA: Diagnosis not present

## 2017-01-30 DIAGNOSIS — M25519 Pain in unspecified shoulder: Secondary | ICD-10-CM | POA: Diagnosis not present

## 2017-02-20 DIAGNOSIS — M0579 Rheumatoid arthritis with rheumatoid factor of multiple sites without organ or systems involvement: Secondary | ICD-10-CM | POA: Diagnosis not present

## 2017-02-20 DIAGNOSIS — R700 Elevated erythrocyte sedimentation rate: Secondary | ICD-10-CM | POA: Diagnosis not present

## 2017-02-20 DIAGNOSIS — Z79899 Other long term (current) drug therapy: Secondary | ICD-10-CM | POA: Diagnosis not present

## 2017-03-30 ENCOUNTER — Other Ambulatory Visit: Payer: Self-pay | Admitting: Gastroenterology

## 2017-05-01 DIAGNOSIS — M47896 Other spondylosis, lumbar region: Secondary | ICD-10-CM | POA: Diagnosis not present

## 2017-05-01 DIAGNOSIS — M25532 Pain in left wrist: Secondary | ICD-10-CM | POA: Diagnosis not present

## 2017-05-01 DIAGNOSIS — M1990 Unspecified osteoarthritis, unspecified site: Secondary | ICD-10-CM | POA: Diagnosis not present

## 2017-05-01 DIAGNOSIS — M461 Sacroiliitis, not elsewhere classified: Secondary | ICD-10-CM | POA: Diagnosis not present

## 2017-05-01 DIAGNOSIS — M25519 Pain in unspecified shoulder: Secondary | ICD-10-CM | POA: Diagnosis not present

## 2017-05-01 DIAGNOSIS — M791 Myalgia, unspecified site: Secondary | ICD-10-CM | POA: Diagnosis not present

## 2017-05-01 DIAGNOSIS — M47814 Spondylosis without myelopathy or radiculopathy, thoracic region: Secondary | ICD-10-CM | POA: Diagnosis not present

## 2017-05-01 DIAGNOSIS — M25559 Pain in unspecified hip: Secondary | ICD-10-CM | POA: Diagnosis not present

## 2017-05-01 DIAGNOSIS — M546 Pain in thoracic spine: Secondary | ICD-10-CM | POA: Diagnosis not present

## 2017-05-01 DIAGNOSIS — M179 Osteoarthritis of knee, unspecified: Secondary | ICD-10-CM | POA: Diagnosis not present

## 2017-05-01 DIAGNOSIS — G894 Chronic pain syndrome: Secondary | ICD-10-CM | POA: Diagnosis not present

## 2017-05-07 DIAGNOSIS — M47814 Spondylosis without myelopathy or radiculopathy, thoracic region: Secondary | ICD-10-CM | POA: Diagnosis not present

## 2017-05-10 DIAGNOSIS — Z23 Encounter for immunization: Secondary | ICD-10-CM | POA: Diagnosis not present

## 2017-05-10 DIAGNOSIS — E785 Hyperlipidemia, unspecified: Secondary | ICD-10-CM | POA: Diagnosis not present

## 2017-05-10 DIAGNOSIS — K210 Gastro-esophageal reflux disease with esophagitis: Secondary | ICD-10-CM | POA: Diagnosis not present

## 2017-05-10 DIAGNOSIS — M069 Rheumatoid arthritis, unspecified: Secondary | ICD-10-CM | POA: Diagnosis not present

## 2017-05-10 DIAGNOSIS — I10 Essential (primary) hypertension: Secondary | ICD-10-CM | POA: Diagnosis not present

## 2017-05-30 DIAGNOSIS — Z79899 Other long term (current) drug therapy: Secondary | ICD-10-CM | POA: Diagnosis not present

## 2017-05-30 DIAGNOSIS — E559 Vitamin D deficiency, unspecified: Secondary | ICD-10-CM | POA: Diagnosis not present

## 2017-05-30 DIAGNOSIS — M0579 Rheumatoid arthritis with rheumatoid factor of multiple sites without organ or systems involvement: Secondary | ICD-10-CM | POA: Diagnosis not present

## 2017-06-15 ENCOUNTER — Other Ambulatory Visit: Payer: Self-pay | Admitting: Nurse Practitioner

## 2017-06-17 NOTE — Telephone Encounter (Signed)
Patient needs ov prior to further refills. Given one month supply. Last seen in 2016.

## 2017-06-18 NOTE — Telephone Encounter (Signed)
Letter mailed to pt to call for appt.

## 2017-10-25 ENCOUNTER — Other Ambulatory Visit: Payer: Self-pay | Admitting: Gastroenterology

## 2018-02-27 ENCOUNTER — Telehealth: Payer: Self-pay | Admitting: Gastroenterology

## 2018-02-27 NOTE — Telephone Encounter (Signed)
Pt is having a lot of problems and was last seen here on 10/01/2014 by Dr. Oneida Alar. She had stopped taking her Protonix and Linzess. Now she is having a lot of stomach issues. She has a lot of sour stomach and feels if she could vomit she would feel better. She has choking spells with her food.  She said Dr. Oneida Alar has told her that she can only stretch her esophagus one more time.  She put herself on soft food due to dental problems.  She has to take a laxative to have a BM.  She is aware we are scheduling out til end of NOV. Pt wants to know what she should do. Forwarding to Dr. Oneida Alar to advise!

## 2018-02-27 NOTE — Telephone Encounter (Signed)
Pt wants to speak with nurse before making an OV. Please call [phone]

## 2018-02-27 NOTE — Telephone Encounter (Signed)
PLEASE CALL PT. I RECOMMEND SHE RE-START THE PROTONIX AND LINZESS. DRINK WATER TO KEEP YOUR URINE LIGHT YELLOW. CONTINUE THE SOFT MECHANICAL DIET. AVOID TRIGGERS FOR REFLUX. PLEASE CALL IN ONE MONTH IF HER SYMPTOMS ARE NOT IMPROVED. OTHERWISE KEEP [facility] IN NOV 2019.

## 2018-02-28 NOTE — Telephone Encounter (Signed)
LMOM to call.

## 2018-03-01 NOTE — Telephone Encounter (Signed)
Letter mailed to pt to call.  

## 2018-03-01 NOTE — Telephone Encounter (Signed)
LMOM to call.

## 2018-03-04 MED ORDER — PANTOPRAZOLE SODIUM 40 MG PO TBEC: DELAYED_RELEASE_TABLET | ORAL | 3 refills | Status: DC

## 2018-03-04 MED ORDER — LINACLOTIDE 145 MCG PO CAPS: ORAL_CAPSULE | ORAL | 3 refills | Status: DC

## 2018-03-04 NOTE — Telephone Encounter (Signed)
Pt is aware of the recommendations. She does need the prescriptions sent in for the Linzess 145 mcg and the Protonix 40 mg bid. She uses the [facility] on [address] / [address] in [place]. Forwarding to Refill box to be sent in in Dr. Nona Dell absence today.

## 2018-03-04 NOTE — Addendum Note (Signed)
Addended by: Annitta Needs on: 03/04/2018 04:41 PM   Modules accepted: Orders

## 2018-03-04 NOTE — Telephone Encounter (Signed)
Done

## 2018-06-07 ENCOUNTER — Encounter: Payer: Self-pay | Admitting: Gastroenterology

## 2018-06-07 ENCOUNTER — Ambulatory Visit (INDEPENDENT_AMBULATORY_CARE_PROVIDER_SITE_OTHER): Payer: Medicare Other | Admitting: Gastroenterology

## 2018-06-07 VITALS — BP 124/78 | HR 66 | Temp 97.2°F | Ht 61.0 in | Wt 147.2 lb

## 2018-06-07 DIAGNOSIS — R1310 Dysphagia, unspecified: Secondary | ICD-10-CM

## 2018-06-07 DIAGNOSIS — R1032 Left lower quadrant pain: Secondary | ICD-10-CM | POA: Diagnosis not present

## 2018-06-07 DIAGNOSIS — R748 Abnormal levels of other serum enzymes: Secondary | ICD-10-CM

## 2018-06-07 DIAGNOSIS — K219 Gastro-esophageal reflux disease without esophagitis: Secondary | ICD-10-CM | POA: Diagnosis not present

## 2018-06-07 DIAGNOSIS — R945 Abnormal results of liver function studies: Secondary | ICD-10-CM

## 2018-06-07 DIAGNOSIS — K5900 Constipation, unspecified: Secondary | ICD-10-CM

## 2018-06-07 LAB — COMPREHENSIVE METABOLIC PANEL
AG Ratio: 1.4 (calc) (ref 1.0–2.5)
ALT: 14 U/L (ref 6–29)
AST: 33 U/L (ref 10–35)
Albumin: 4.5 g/dL (ref 3.6–5.1)
Alkaline phosphatase (APISO): 94 U/L (ref 33–130)
BUN/Creatinine Ratio: 17 (calc) (ref 6–22)
BUN: 27 mg/dL — ABNORMAL HIGH (ref 7–25)
CO2: 28 mmol/L (ref 20–32)
Calcium: 9.6 mg/dL (ref 8.6–10.4)
Chloride: 101 mmol/L (ref 98–110)
Creat: 1.56 mg/dL — ABNORMAL HIGH (ref 0.50–0.99)
Globulin: 3.3 g/dL (calc) (ref 1.9–3.7)
Glucose, Bld: 93 mg/dL (ref 65–139)
Potassium: 4 mmol/L (ref 3.5–5.3)
Sodium: 138 mmol/L (ref 135–146)
Total Bilirubin: 0.3 mg/dL (ref 0.2–1.2)
Total Protein: 7.8 g/dL (ref 6.1–8.1)

## 2018-06-07 LAB — CBC WITH DIFFERENTIAL/PLATELET
Basophils Absolute: 72 cells/uL (ref 0–200)
Basophils Relative: 0.7 %
Eosinophils Absolute: 196 cells/uL (ref 15–500)
Eosinophils Relative: 1.9 %
HCT: 37 % (ref 35.0–45.0)
Hemoglobin: 12.4 g/dL (ref 11.7–15.5)
Lymphs Abs: 1875 cells/uL (ref 850–3900)
MCH: 29.6 pg (ref 27.0–33.0)
MCHC: 33.5 g/dL (ref 32.0–36.0)
MCV: 88.3 fL (ref 80.0–100.0)
MPV: 9.7 fL (ref 7.5–12.5)
Monocytes Relative: 12.4 %
Neutro Abs: 6880 cells/uL (ref 1500–7800)
Neutrophils Relative %: 66.8 %
Platelets: 324 10*3/uL (ref 140–400)
RBC: 4.19 10*6/uL (ref 3.80–5.10)
RDW: 14.2 % (ref 11.0–15.0)
Total Lymphocyte: 18.2 %
WBC mixed population: 1277 cells/uL — ABNORMAL HIGH (ref 200–950)
WBC: 10.3 10*3/uL (ref 3.8–10.8)

## 2018-06-07 NOTE — Patient Instructions (Signed)
1. Try Linzess 42mcg once daily on empty stomach. If works better than the 162mcg, let me know and we will change your prescription.  2. Please have labs today in preparation for upcoming CT scan. 3. I office will call you Monday to schedule CT scan.  4. I will discuss possible upper endoscopy with Dr. Oneida Alar. Further recommendations to follow.

## 2018-06-07 NOTE — Progress Notes (Addendum)
Primary Care Physician:  Sinda Du, MD  Primary Gastroenterologist:  Barney Drain, MD  REVIEWED. EGD/DIL W/ MAC 1st available. SOFT MECHANICAL DIET UNTIL EGD/DIL.  Chief Complaint  Patient presents with  . Dysphagia  . Constipation    HPI:  Sharon Spencer is a 67 y.o. female here for further evaluation of dysphagia, constipation. Last seen in 2016. She also has h/o abnormal LFTs felt to be due to medication for RA.   Last EGD December 2013 showed Schatzki ring status post dilation, gastritis (biopsied in November 2013 at time of EGD, no H. pylori).  Last colonoscopy November 2013, sessile polyp measuring 3 mm removed (tubular adenoma).  Next colonoscopy November 2023.  Liver biopsy 2014, elevated LFTs most likely secondary to meds (Plaquenil/Humira).  Barium pill esophagram April 2016 for dysphagia showed tiny sliding hiatal hernia, age-related esophageal dysmotility.  No esophageal stricture.  Last LFTs on file from March 2016 were normal.  No longer on Humira. On orencia for 9 months, then switched to [place]. Still on Plaquenil. States her LFTs are still up. Will request records. [facility] provides comprehensive care for RA, chronic pain etc.  Patient complains of having increased difficulty swallowing. EGD/ED helped in past but symptoms would return. Food comes back up, feels like going down but then throws it back up. Happens with rice, potatoes. Sometimes wakes up choking like saliva went down the "wrong way". Happens at times with liquids especially carbonated beverages. She finely chops all meats and uses gravy. Living on yogurt and jello. She takes PPI BID to control heartburn. If misses night time dose she suffers. Linzess helps constipation but cannot take everyday due to diarrhea. Currently on LInzess 17mg. Often feels like stools incomplete. No melena, brbpr. Complains of discomfort in left lower abdomen. Sometimes better with BM. Unrelated to meals or position.    Current  Outpatient Medications  Medication Sig Dispense Refill  . acetaminophen (TYLENOL) 500 MG tablet Take 1,000 mg by mouth every 8 (eight) hours as needed.    . ALPRAZolam (XANAX) 0.5 MG tablet Take 0.5 mg by mouth at bedtime as needed. Hypertension.    .Marland KitchenamLODipine (NORVASC) 10 MG tablet Take 10 mg by mouth daily.     .Marland Kitchenatenolol (TENORMIN) 50 MG tablet Take 50 mg by mouth daily.     . clobetasol cream (TEMOVATE) 00.34% Apply 1 application topically 2 (two) times daily.     . CYMBALTA 60 MG capsule Take 60 mg by mouth daily.     . hydroxychloroquine (PLAQUENIL) 200 MG tablet Take 400 mg by mouth daily.     .Marland Kitchenlinaclotide (LINZESS) 145 MCG CAPS capsule TAKE ONE CAPSULE BY MOUTH EVERY DAY 30 MINUTES BEFORE THE FIRST MEAL OF THE DAY 90 capsule 3  . LINZESS 145 MCG CAPS capsule TAKE 1 CAPSULE BY MOUTH EVERY DAY 30 MINUTES PRIOR TO YOUR FIRST MEAL 30 capsule 11  . meloxicam (MOBIC) 15 MG tablet Take 1 tablet by mouth daily.    .Marland KitchenoxyCODONE (ROXICODONE) 15 MG immediate release tablet Take 15 mg by mouth as needed for pain.    . pantoprazole (PROTONIX) 40 MG tablet TAKE 1 TABLET BY MOUTH TWICE DAILY 30 MINUTES PRIOR TO BREAKFAST AND DINNER (Patient taking differently: Take 40 mg by mouth daily. TAKE 1 TABLET BY MOUTH TWICE DAILY 30 MINUTES PRIOR TO BREAKFAST AND DINNER Sometimes takes bid) 180 tablet 3  . prednisoLONE acetate (PRED FORTE) 1 % ophthalmic suspension Place 1 drop into the left eye 3 (  three) times daily.    Marland Kitchen tiZANidine (ZANAFLEX) 4 MG tablet Take 4 mg by mouth at bedtime.     . Tofacitinib Citrate (XELJANZ PO) Take by mouth 2 (two) times daily.     No current facility-administered medications for this visit.    Facility-Administered Medications Ordered in Other Visits  Medication Dose Route Frequency Provider Last Rate Last Dose  . mineral oil light 100 % (sterile)    PRN Danie Binder, MD   1 application at 16/10/96 1345    Allergies as of 06/07/2018 - Review Complete 06/07/2018   Allergen Reaction Noted  . Carafate [sucralfate]  10/01/2014  . Compazine [prochlorperazine edisylate] Other (See Comments) 05/22/2012  . Sulfa antibiotics Nausea And Vomiting 05/22/2012    Past Medical History:  Diagnosis Date  . Anxiety   . Arthritis    rheumatoid  . Carpal tunnel syndrome   . Depression   . Diabetes mellitus ([place])   . Elevated liver enzymes 02/27/2013   AUG 2014 GG 470 ALT 41 AST 32 ALK PHOS 275   . Fibromyalgia   . HTN (hypertension)   . Lumbago   . Migraine   . Myalgia and myositis, unspecified   . Osteoporosis   . PONV (postoperative nausea and vomiting)   . Recurrent UTI    specialist at [facility]  . Rheumatoid arthritis(714.0)    sepcialist at [facility], Dr. Lynwood Dawley  . Sinusitis   . TIA (transient ischemic attack) 1991, 2006    Past Surgical History:  Procedure Laterality Date  . BACK SURGERY     lower  . COLONOSCOPY  2003   Dr. Rehman--> ischemic colitis  . COLONOSCOPY WITH PROPOFOL  06/04/2012   SLF:A sessile polyp measuring 3 mm   . ESOPHAGOGASTRODUODENOSCOPY (EGD) WITH ESOPHAGEAL DILATION  06/17/2012   EAV:[id number] ring was found at the gastroesophageal junction/Non-erosive gastritis (inflammation)  . ESOPHAGOGASTRODUODENOSCOPY (EGD) WITH PROPOFOL  06/04/2012   YNW:GNFAOZHY ring was found at the gastroesophageal junction/SIGMOID ESOPHAGUS(DISTAL)/Non-erosive gastritis (inflammation)  . NASAL SINUS SURGERY     x3  . PARTIAL HYSTERECTOMY    . POLYPECTOMY  06/04/2012   Procedure: POLYPECTOMY;  Surgeon: Danie Binder, MD;  Location: [facility];  Service: Endoscopy;  Laterality: N/A;  . ROTATOR CUFF REPAIR    . SAVORY DILATION  06/04/2012   Procedure: SAVORY DILATION;  Surgeon: Danie Binder, MD;  Location: [facility];  Service: Endoscopy;  Laterality: N/A;  53m, 12.8 mm, 14 mm  . SHOULDER SURGERY     left  . TONSILLECTOMY    . TUBAL LIGATION      Family History  Problem Relation Age of Onset  . Colon cancer Neg Hx   . Diabetes Father   .  Kidney failure Father   . Breast cancer Mother   . Stroke Mother   . Diabetes Unknown        siblings  . Lymphoma Sister   . Liver disease Unknown        history of elevated LFTs in sister too    Social History   Socioeconomic History  . Marital status: Married    Spouse name: Not on file  . Number of children: 1  . Years of education: Not on file  . Highest education level: Not on file  Occupational History  . Occupation: disability    Employer: UNEMPLOYED  Social Needs  . Financial resource strain: Not on file  . Food insecurity:    Worry: Not on file  Inability: Not on file  . Transportation needs:    Medical: Not on file    Non-medical: Not on file  Tobacco Use  . Smoking status: Never Smoker  . Smokeless tobacco: Never Used  Substance and Sexual Activity  . Alcohol use: No  . Drug use: No  . Sexual activity: Not on file  Lifestyle  . Physical activity:    Days per week: Not on file    Minutes per session: Not on file  . Stress: Not on file  Relationships  . Social connections:    Talks on phone: Not on file    Gets together: Not on file    Attends religious service: Not on file    Active member of club or organization: Not on file    Attends meetings of clubs or organizations: Not on file    Relationship status: Not on file  . Intimate partner violence:    Fear of current or ex partner: Not on file    Emotionally abused: Not on file    Physically abused: Not on file    Forced sexual activity: Not on file  Other Topics Concern  . Not on file  Social History Narrative  . Not on file      ROS:  General: Negative for anorexia, weight loss, fever, chills, fatigue, weakness. Eyes: Negative for vision changes.  ENT: Negative for hoarseness, nasal congestion.see hpi CV: Negative for chest pain, angina, palpitations, dyspnea on exertion, peripheral edema.  Respiratory: Negative for dyspnea at rest, dyspnea on exertion, cough, sputum, wheezing.  GI:  See history of present illness. GU:  Negative for dysuria, hematuria, urinary incontinence, urinary frequency, nocturnal urination.  MS: + for joint pain, low back pain.  Derm: Negative for rash or itching.  Neuro: Negative for weakness, abnormal sensation, seizure, frequent headaches, memory loss, confusion.  Psych: Negative for anxiety, depression, suicidal ideation, hallucinations.  Endo: Negative for unusual weight change.  Heme: Negative for bruising or bleeding. Allergy: Negative for rash or hives.    Physical Examination:  BP 124/78   Pulse 66   Temp (!) 97.2 F (36.2 C) (Oral)   Ht _0  (1.549 m)   Wt 147 lb 3.2 oz (66.8 kg)   BMI 27.81 kg/m    General: Well-nourished, well-developed in no acute distress.  Head: Normocephalic, atraumatic.   Eyes: Conjunctiva pink, no icterus. Mouth: Oropharyngeal mucosa moist and pink , no lesions erythema or exudate. Neck: Supple without thyromegaly, masses, or lymphadenopathy.  Lungs: Clear to auscultation bilaterally.  Heart: Regular rate and rhythm, no murmurs rubs or gallops.  Abdomen: Bowel sounds are normal, no hepatosplenomegaly, no abdominal bruits or    hernia , no rebound or guarding.  Fullness in LLQ with tenderness Rectal:not performed Extremities: No lower extremity edema. No clubbing or deformities.  Neuro: Alert and oriented x 4 , grossly normal neurologically.  Skin: Warm and dry, no rash or jaundice.   Psych: Alert and cooperative, normal mood and affect.

## 2018-06-09 NOTE — Assessment & Plan Note (Signed)
Recheck at this time. She reports since last seen here in 2016 that LFTs have been up again.

## 2018-06-09 NOTE — Assessment & Plan Note (Signed)
Well controlled but requires BID dosing or will have breakthrough symptoms.

## 2018-06-09 NOTE — Assessment & Plan Note (Signed)
Reduce Linzess to 23mcg daily so hopefully can avoid diarrhea and being able to take Linzess more routinely. Samples provided. If effective then will provide RX.

## 2018-06-09 NOTE — Assessment & Plan Note (Signed)
Progressive solid food dysphagia. Prior known Schatzki's ring s/p dilation, last time in 2013. BPE with age-related esophageal dysmotility in 2016. To discuss possible EGD vs repeat BPE first with Dr. Oneida Alar.

## 2018-06-09 NOTE — Assessment & Plan Note (Signed)
LLQ tenderness and fullness on exam. No discrete mass. Unclear etiology. Obtain CT A/P with contrast.

## 2018-06-10 ENCOUNTER — Telehealth: Payer: Self-pay | Admitting: Gastroenterology

## 2018-06-10 NOTE — Progress Notes (Signed)
LMOM to call.

## 2018-06-10 NOTE — Progress Notes (Signed)
PT is aware.  Sharon Spencer, please send the lab to PCP.

## 2018-06-10 NOTE — Telephone Encounter (Signed)
Pt returning call to DS. Please call her back [phone]

## 2018-06-10 NOTE — Progress Notes (Signed)
cc'ed to pcp °

## 2018-06-10 NOTE — Telephone Encounter (Signed)
See result note. Pt is aware.  

## 2018-06-12 ENCOUNTER — Telehealth: Payer: Self-pay | Admitting: Gastroenterology

## 2018-06-12 NOTE — Telephone Encounter (Signed)
Pt was returning a call from DS> Please call her back at [phone]

## 2018-06-12 NOTE — Telephone Encounter (Signed)
PT had a vm from a previous call, but is aware I had spoke with her about her lab results.

## 2018-06-26 ENCOUNTER — Ambulatory Visit (HOSPITAL_COMMUNITY)
Admission: RE | Admit: 2018-06-26 | Discharge: 2018-06-26 | Disposition: A | Discharge status: Home / Self Care | Payer: Medicare Other | Source: Ambulatory Visit | Attending: Gastroenterology | Admitting: Gastroenterology

## 2018-06-26 DIAGNOSIS — R1032 Left lower quadrant pain: Secondary | ICD-10-CM | POA: Diagnosis present

## 2018-06-26 DIAGNOSIS — R945 Abnormal results of liver function studies: Secondary | ICD-10-CM | POA: Diagnosis present

## 2018-06-26 MED ORDER — IOPAMIDOL (ISOVUE-300) INJECTION 61%
100.0000 mL | Freq: Once | INTRAVENOUS | Status: AC | PRN
  Administered 2018-06-26: 75 mL via INTRAVENOUS

## 2018-07-03 ENCOUNTER — Telehealth: Payer: Self-pay | Admitting: Gastroenterology

## 2018-07-03 NOTE — Progress Notes (Signed)
Pt is aware.  Forwarding to [place] to nic the MRI in 6-12 months. Pt would like copy of this sent to Dr.Hawkins. Also, she would like a copy sent to Lynwood Dawley, Rheumatologist at [facility] in [place] if she needs to come in and sign a release, she will be glad to do so. Forwarding to Manuela Schwartz to take care of this.

## 2018-07-03 NOTE — Telephone Encounter (Signed)
Pt was returning a call from [place]. [phone]

## 2018-07-03 NOTE — Telephone Encounter (Signed)
See result note. I have spoken to pt.  

## 2018-07-03 NOTE — Progress Notes (Signed)
LMOM to call.

## 2018-07-28 ENCOUNTER — Telehealth: Payer: Self-pay | Admitting: Gastroenterology

## 2018-07-28 NOTE — Telephone Encounter (Signed)
Please let patient know that [facility] recommends EGD/DIL [facility] 1st available. Continue soft mechanical diet until EGD/DIL. Please give handout.

## 2018-07-29 ENCOUNTER — Other Ambulatory Visit: Payer: Self-pay | Admitting: *Deleted

## 2018-07-29 DIAGNOSIS — R1310 Dysphagia, unspecified: Secondary | ICD-10-CM

## 2018-07-29 NOTE — Telephone Encounter (Signed)
Spoke with patient and she is scheduled for 10/01/2018 at 2:30pm. Patient aware will need to go for pre-op appt and will call back with this appt. Instructions mailed. Orders entered.

## 2018-07-29 NOTE — Telephone Encounter (Signed)
Pre-op scheduled for 09/26/2018 at 10:00am. Patient aware. Letter mailed.

## 2018-07-29 NOTE — Telephone Encounter (Signed)
Pt is aware. Ok to schedule. Soft mechanical diet mailed to pt.

## 2018-09-26 ENCOUNTER — Other Ambulatory Visit: Payer: Self-pay

## 2018-09-26 ENCOUNTER — Encounter (HOSPITAL_COMMUNITY)
Admission: RE | Admit: 2018-09-26 | Discharge: 2018-09-26 | Disposition: A | Discharge status: Home / Self Care | Payer: Medicare Other | Source: Ambulatory Visit | Attending: Gastroenterology | Admitting: Gastroenterology

## 2018-09-30 ENCOUNTER — Telehealth: Payer: Self-pay

## 2018-09-30 NOTE — Telephone Encounter (Signed)
Tried to call pt to see if she can arrive earlier tomorrow for EGD/DIL w/Propofol w/SLF, no answer, LMOVM for return call.

## 2018-09-30 NOTE — Telephone Encounter (Signed)
Pt called back, she will arrive tomorrow at 10:30am for 12:00pm procedure. Advised her to be NPO after 8:00am tomorrow morning. LMOVM for endo scheduler.

## 2018-10-01 ENCOUNTER — Ambulatory Visit (HOSPITAL_COMMUNITY)
Admission: RE | Admit: 2018-10-01 | Discharge: 2018-10-01 | Disposition: A | Discharge status: Home / Self Care | Payer: Medicare Other | Attending: Gastroenterology | Admitting: Gastroenterology

## 2018-10-01 ENCOUNTER — Ambulatory Visit (HOSPITAL_COMMUNITY): Payer: Medicare Other | Admitting: Anesthesiology

## 2018-10-01 ENCOUNTER — Encounter (HOSPITAL_COMMUNITY): Payer: Self-pay

## 2018-10-01 ENCOUNTER — Encounter (HOSPITAL_COMMUNITY)
Admission: RE | Disposition: A | Discharge status: Home / Self Care | Payer: Self-pay | Source: Home / Self Care | Attending: Gastroenterology

## 2018-10-01 ENCOUNTER — Other Ambulatory Visit: Payer: Self-pay

## 2018-10-01 DIAGNOSIS — I10 Essential (primary) hypertension: Secondary | ICD-10-CM | POA: Diagnosis not present

## 2018-10-01 DIAGNOSIS — F329 Major depressive disorder, single episode, unspecified: Secondary | ICD-10-CM | POA: Diagnosis not present

## 2018-10-01 DIAGNOSIS — R1310 Dysphagia, unspecified: Secondary | ICD-10-CM

## 2018-10-01 DIAGNOSIS — E119 Type 2 diabetes mellitus without complications: Secondary | ICD-10-CM | POA: Diagnosis not present

## 2018-10-01 DIAGNOSIS — Z791 Long term (current) use of non-steroidal anti-inflammatories (NSAID): Secondary | ICD-10-CM | POA: Diagnosis not present

## 2018-10-01 DIAGNOSIS — Z8673 Personal history of transient ischemic attack (TIA), and cerebral infarction without residual deficits: Secondary | ICD-10-CM | POA: Diagnosis not present

## 2018-10-01 DIAGNOSIS — F419 Anxiety disorder, unspecified: Secondary | ICD-10-CM | POA: Diagnosis not present

## 2018-10-01 DIAGNOSIS — K2970 Gastritis, unspecified, without bleeding: Secondary | ICD-10-CM | POA: Insufficient documentation

## 2018-10-01 DIAGNOSIS — K222 Esophageal obstruction: Secondary | ICD-10-CM | POA: Insufficient documentation

## 2018-10-01 DIAGNOSIS — Z79899 Other long term (current) drug therapy: Secondary | ICD-10-CM | POA: Diagnosis not present

## 2018-10-01 DIAGNOSIS — M069 Rheumatoid arthritis, unspecified: Secondary | ICD-10-CM | POA: Insufficient documentation

## 2018-10-01 DIAGNOSIS — K210 Gastro-esophageal reflux disease with esophagitis: Secondary | ICD-10-CM | POA: Insufficient documentation

## 2018-10-01 DIAGNOSIS — M797 Fibromyalgia: Secondary | ICD-10-CM | POA: Insufficient documentation

## 2018-10-01 DIAGNOSIS — K449 Diaphragmatic hernia without obstruction or gangrene: Secondary | ICD-10-CM | POA: Diagnosis not present

## 2018-10-01 HISTORY — PX: SAVORY DILATION: SHX5439

## 2018-10-01 HISTORY — PX: ESOPHAGOGASTRODUODENOSCOPY (EGD) WITH PROPOFOL: SHX5813

## 2018-10-01 SURGERY — ESOPHAGOGASTRODUODENOSCOPY (EGD) WITH PROPOFOL
Anesthesia: General

## 2018-10-01 MED ORDER — HYDROCODONE-ACETAMINOPHEN 7.5-325 MG PO TABS: 1.0000 | ORAL_TABLET | Freq: Once | ORAL | Status: DC | PRN

## 2018-10-01 MED ORDER — PROPOFOL 10 MG/ML IV BOLUS
INTRAVENOUS | Status: AC
  Filled 2018-10-01: qty 40

## 2018-10-01 MED ORDER — CHLORHEXIDINE GLUCONATE CLOTH 2 % EX PADS: 6.0000 | MEDICATED_PAD | Freq: Once | CUTANEOUS | Status: DC

## 2018-10-01 MED ORDER — KETAMINE HCL 50 MG/5ML IJ SOSY
PREFILLED_SYRINGE | INTRAMUSCULAR | Status: AC
  Filled 2018-10-01: qty 5

## 2018-10-01 MED ORDER — PROPOFOL 10 MG/ML IV BOLUS
INTRAVENOUS | Status: DC | PRN
  Administered 2018-10-01: 15 mg via INTRAVENOUS

## 2018-10-01 MED ORDER — HYDROMORPHONE HCL 1 MG/ML IJ SOLN: 0.2500 mg | INTRAMUSCULAR | Status: DC | PRN

## 2018-10-01 MED ORDER — PROPOFOL 500 MG/50ML IV EMUL
INTRAVENOUS | Status: DC | PRN
  Administered 2018-10-01: 125 ug/kg/min via INTRAVENOUS

## 2018-10-01 MED ORDER — MIDAZOLAM HCL 2 MG/2ML IJ SOLN: 0.5000 mg | Freq: Once | INTRAMUSCULAR | Status: DC | PRN

## 2018-10-01 MED ORDER — LACTATED RINGERS IV SOLN
INTRAVENOUS | Status: DC
  Administered 2018-10-01: 11:00:00 via INTRAVENOUS

## 2018-10-01 MED ORDER — LIDOCAINE VISCOUS HCL 2 % MT SOLN
OROMUCOSAL | Status: AC
  Filled 2018-10-01: qty 15

## 2018-10-01 MED ORDER — LIDOCAINE VISCOUS HCL 2 % MT SOLN
15.0000 mL | Freq: Once | OROMUCOSAL | Status: AC
  Administered 2018-10-01: 15 mL via OROMUCOSAL

## 2018-10-01 MED ORDER — KETAMINE HCL 10 MG/ML IJ SOLN
INTRAMUSCULAR | Status: DC | PRN
  Administered 2018-10-01: 15 mg via INTRAVENOUS

## 2018-10-01 NOTE — Discharge Instructions (Signed)
YOU HAVE ESOPHAGITIS AND A STRICTURE DUE TO ACID REFLUX. I dilated your esophagus DUE TO YOUR PROBLEM SWALLOWING. You have A SMALL HIATAL HERNIA, AND gastritis.   DRINK WATER TO KEEP YOUR URINE LIGHT YELLOW.  AVOID REFLUX TRIGGERS. SEE INFO BELOW.  CONTINUE PROTONIX. TAKE 30 MINUTES PRIOR TO MEALS ONCE OR TWICE DAILY.  FOLLOW UP IN 4 MOS.  UPPER ENDOSCOPY AFTER CARE Read the instructions outlined below and refer to this sheet in the next week. These discharge instructions provide you with general information on caring for yourself after you leave the hospital. While your treatment has been planned according to the most current medical practices available, unavoidable complications occasionally occur. If you have any problems or questions after discharge, call DR. Nazanin Kinner, [phone].  ACTIVITY  You may resume your regular activity, but move at a slower pace for the next 24 hours.   Take frequent rest periods for the next 24 hours.   Walking will help get rid of the air and reduce the bloated feeling in your belly (abdomen).   No driving for 24 hours (because of the medicine (anesthesia) used during the test).   You may shower.   Do not sign any important legal documents or operate any machinery for 24 hours (because of the anesthesia used during the test).    NUTRITION  Drink plenty of fluids.   You may resume your normal diet as instructed by your doctor.   Begin with a light meal and progress to your normal diet. Heavy or fried foods are harder to digest and may make you feel sick to your stomach (nauseated).   Avoid alcoholic beverages for 24 hours or as instructed.    MEDICATIONS  You may resume your normal medications.   WHAT YOU CAN EXPECT TODAY  Some feelings of bloating in the abdomen.   Passage of more gas than usual.    IF YOU HAD A BIOPSY TAKEN DURING THE UPPER ENDOSCOPY:  Eat a soft diet IF YOU HAVE NAUSEA, BLOATING, ABDOMINAL PAIN, OR VOMITING.     FINDING OUT THE RESULTS OF YOUR TEST Not all test results are available during your visit. DR. Oneida Alar WILL CALL YOU WITHIN 14 DAYS OF YOUR PROCEDUE WITH YOUR RESULTS. Do not assume everything is normal if you have not heard from DR. Imogine Carvell, CALL HER OFFICE AT [phone].  SEEK IMMEDIATE MEDICAL ATTENTION AND CALL THE OFFICE: [phone] IF:  You have more than a spotting of blood in your stool.   Your belly is swollen (abdominal distention).   You are nauseated or vomiting.   You have a temperature over 101F.   You have abdominal pain or discomfort that is severe or gets worse throughout the day.  Gastritis  Gastritis is an inflammation (the body's way of reacting to injury and/or infection) of the stomach. It is often caused by viral or bacterial (germ) infections. It can also be caused BY ASPIRIN, BC/GOODY POWDER'S, (IBUPROFEN) MOTRIN, OR ALEVE (NAPROXEN), chemicals (including alcohol), SPICY FOODS, and medications. This illness may be associated with generalized malaise (feeling tired, not well), UPPER ABDOMINAL STOMACH cramps, and fever. One common bacterial cause of gastritis is an organism known as H. Pylori. This can be treated with antibiotics.   ESOPHAGEAL STRICTURE  Esophageal strictures can be caused by stomach acid backing up into the tube that carries food from the mouth down to the stomach (lower esophagus).  TREATMENT There are a number of medicines used to treat reflux/stricture, including: Antacids.  Proton-pump inhibitors:  PROTONIX PEPCID    Lifestyle and home remedies TO CONTROL HEARTBURN/REFLUX  You may eliminate or reduce the frequency of heartburn by making the following lifestyle changes:   Control your weight. Being overweight is a major risk factor for heartburn and GERD. Excess pounds put pressure on your abdomen, pushing up your stomach and causing acid to back up into your esophagus.    Eat smaller meals. 4 TO 6 MEALS A DAY. This reduces pressure  on the lower esophageal sphincter, helping to prevent the valve from opening and acid from washing back into your esophagus.    Loosen your belt. Clothes that fit tightly around your waist put pressure on your abdomen and the lower esophageal sphincter.    Eliminate heartburn triggers. Everyone has specific triggers. Common triggers such as fatty or fried foods, spicy food, tomato sauce, carbonated beverages, alcohol, chocolate, mint, garlic, onion, caffeine and nicotine may make heartburn worse.    Avoid stooping or bending. Tying your shoes is OK. Bending over for longer periods to weed your garden isn't, especially soon after eating.    Don't lie down after a meal. Wait at least three to four hours after eating before going to bed, and don't lie down right after eating.    Alternative medicine  Several home remedies exist for treating GERD, but they provide only temporary relief. They include drinking baking soda (sodium bicarbonate) added to water or drinking other fluids such as baking soda mixed with cream of tartar and water.   Although these liquids create temporary relief by neutralizing, washing away or buffering acids, eventually they aggravate the situation by adding gas and fluid to your stomach, increasing pressure and causing more acid reflux. Further, adding more sodium to your diet may increase your blood pressure and add stress to your heart, and excessive bicarbonate ingestion can alter the acid-base balance in your body.

## 2018-10-01 NOTE — Op Note (Signed)
[facility] Patient Name: Sharon Spencer Procedure Date: 10/01/2018 11:27 AM MRN: [id number] Date of Birth: [birth year] Attending MD: Barney Drain MD, MD CSN: [id number] Age: 68 Admit Type: Outpatient Procedure:                Upper GI endoscopy WITH ESOPHAGEAL DILATION Indications:              Dysphagia Providers:                Barney Drain MD, MD, Otis Peak B. Sharon Seller, RN, Aram Candela Referring MD:             Jasper Loser. Luan Pulling MD, MD Medicines:                Propofol per Anesthesia Complications:            No immediate complications. Estimated Blood Loss:     Estimated blood loss was minimal. Procedure:                Pre-Anesthesia Assessment:                           - Prior to the procedure, a History and Physical                            was performed, and patient medications and                            allergies were reviewed. The patient's tolerance of                            previous anesthesia was also reviewed. The risks                            and benefits of the procedure and the sedation                            options and risks were discussed with the patient.                            All questions were answered, and informed consent                            was obtained. Prior Anticoagulants: The patient has                            taken previous NSAID medication(MOBIC), last dose                            was 1 day prior to procedure. ASA Grade Assessment:                            II - A patient with mild systemic disease. After  reviewing the risks and benefits, the patient was                            deemed in satisfactory condition to undergo the                            procedure. After obtaining informed consent, the                            endoscope was passed under direct vision.                            Throughout the procedure, the patient's blood                             pressure, pulse, and oxygen saturations were                            monitored continuously. The GIF-[id number] ([id number])                            scope was introduced through the mouth, and                            advanced to the second part of duodenum. The upper                            GI endoscopy was accomplished without difficulty.                            The patient tolerated the procedure well. Scope In: 1:03:20 PM Scope Out: 1:09:53 PM Total Procedure Duration: 0 hours 6 minutes 33 seconds  Findings:      LA Grade B (one or more mucosal breaks greater than 5 mm, not extending       between the tops of two mucosal folds) esophagitis with bleeding was       found.      One benign-appearing, intrinsic moderate (circumferential scarring or       stenosis; an endoscope may pass) stenosis was found. This stenosis       measured 1.4 cm (inner diameter). The stenosis was traversed. A       guidewire was placed and the scope was withdrawn. Dilation was performed       with a Savary dilator with moderate resistance at 16 mm and 17 mm.       Estimated blood loss was minimal(TRACE HEME).      A small hiatal hernia was present.      Patchy mild inflammation characterized by congestion (edema) and       erythema was found in the gastric body and in the gastric antrum.      The examined duodenum was normal. Impression:               - LA Grade B reflux esophagitis.                           - Benign-appearing esophageal PEPTIC  STRICTURE                           - Small hiatal hernia.                           - MILD Gastritis DUE TO MOBIC. Moderate Sedation:      Per Anesthesia Care Recommendation:           - Patient has a contact number available for                            emergencies. The signs and symptoms of potential                            delayed complications were discussed with the                            patient. Return to normal activities tomorrow.                             Written discharge instructions were provided to the                            patient.                           - Low fat diet.                           - Continue present medications. PROTNIX 30 MINS                            PRIOR TO MEALS ONCE OR TWICE A DAY.                           - Return to my office in 4 months. Procedure Code(s):        --- Professional ---                           [phone], Esophagogastroduodenoscopy, flexible,                            transoral; with insertion of guide wire followed by                            passage of dilator(s) through esophagus over guide                            wire Diagnosis Code(s):        --- Professional ---                           K21.0, Gastro-esophageal reflux disease with                            esophagitis  K22.2, Esophageal obstruction                           K44.9, Diaphragmatic hernia without obstruction or                            gangrene                           K29.70, Gastritis, unspecified, without bleeding                           R13.10, Dysphagia, unspecified CPT copyright 2018 American Medical Association. All rights reserved. The codes documented in this report are preliminary and upon coder review may  be revised to meet current compliance requirements. Barney Drain, MD Barney Drain MD, MD 10/01/2018 1:26:11 PM This report has been signed electronically. Number of Addenda: 0

## 2018-10-01 NOTE — Anesthesia Preprocedure Evaluation (Signed)
Anesthesia Evaluation  Patient identified by MRN, date of birth, ID band Patient awake    Reviewed: Allergy & Precautions, NPO status , Patient's Chart, lab work & pertinent test results  History of Anesthesia Complications (+) PONV  Airway Mallampati: II  TM Distance: >3 FB Neck ROM: Full    Dental no notable dental hx.    Pulmonary neg pulmonary ROS,    Pulmonary exam normal breath sounds clear to auscultation       Cardiovascular Exercise Tolerance: Poor hypertension, Pt. on medications negative cardio ROS Normal cardiovascular examII Rhythm:Regular Rate:Normal     Neuro/Psych  Headaches, Anxiety Depression TIA Neuromuscular disease negative psych ROS   GI/Hepatic Neg liver ROS, GERD  Medicated and Controlled,  Endo/Other  negative endocrine ROSdiabetes  Renal/GU negative Renal ROS  negative genitourinary   Musculoskeletal  (+) Arthritis , Osteoarthritis,  Fibromyalgia -  Abdominal   Peds negative pediatric ROS (+)  Hematology negative hematology ROS (+)   Anesthesia Other Findings   Reproductive/Obstetrics negative OB ROS                             Anesthesia Physical Anesthesia Plan  ASA: III  Anesthesia Plan: General   Post-op Pain Management:    Induction: Intravenous  PONV Risk Score and Plan:   Airway Management Planned: Nasal Cannula and Simple Face Mask  Additional Equipment:   Intra-op Plan:   Post-operative Plan: Extubation in OR  Informed Consent: I have reviewed the patients History and Physical, chart, labs and discussed the procedure including the risks, benefits and alternatives for the proposed anesthesia with the patient or authorized representative who has indicated his/her understanding and acceptance.     Dental advisory given  Plan Discussed with: CRNA  Anesthesia Plan Comments: (GA planned , GETA as needed d/w pt and family - AVU- WTP)         Anesthesia Quick Evaluation

## 2018-10-01 NOTE — Transfer of Care (Signed)
Immediate Anesthesia Transfer of Care Note  Patient: Sharon Spencer  Procedure(s) Performed: ESOPHAGOGASTRODUODENOSCOPY (EGD) WITH PROPOFOL (N/A ) SAVORY DILATION (N/A )  Patient Location: PACU  Anesthesia Type:General  Level of Consciousness: awake and patient cooperative  Airway & Oxygen Therapy: Patient Spontanous Breathing  Post-op Assessment: Report given to RN, Post -op Vital signs reviewed and stable and Patient moving all extremities  Post vital signs: Reviewed and stable  Last Vitals:  Vitals Value Taken Time  BP    Temp    Pulse 72 10/01/2018  1:18 PM  Resp    SpO2 97 % 10/01/2018  1:18 PM  Vitals shown include unvalidated device data.  Last Pain:  Vitals:   10/01/18 1255  TempSrc:   PainSc: 0-No pain      Patients Stated Pain Goal: 7 (60/10/93 2355)  Complications: No apparent anesthesia complications

## 2018-10-01 NOTE — Anesthesia Postprocedure Evaluation (Signed)
Anesthesia Post Note  Patient: Sharon Spencer  Procedure(s) Performed: ESOPHAGOGASTRODUODENOSCOPY (EGD) WITH PROPOFOL (N/A ) SAVORY DILATION (N/A )  Patient location during evaluation: PACU Anesthesia Type: General Level of consciousness: awake and oriented Pain management: pain level controlled Vital Signs Assessment: post-procedure vital signs reviewed and stable Respiratory status: spontaneous breathing, nonlabored ventilation and respiratory function stable Cardiovascular status: blood pressure returned to baseline Postop Assessment: no apparent nausea or vomiting Anesthetic complications: no     Last Vitals:  Vitals:   10/01/18 1022  BP: (!) 165/85  Pulse: 69  Resp: (!) 22  Temp: 36.6 C  SpO2: 98%    Last Pain:  Vitals:   10/01/18 1255  TempSrc:   PainSc: 0-No pain                 [name]

## 2018-10-01 NOTE — H&P (Signed)
Primary Care Physician:  Sinda Du, MD Primary Gastroenterologist:  Dr. Oneida Alar  Pre-Procedure History & Physical: HPI:  Sharon Spencer is a 68 y.o. female here for   Past Medical History:  Diagnosis Date  . Anxiety   . Arthritis    rheumatoid  . Carpal tunnel syndrome   . Depression   . Diabetes mellitus ([place])   . Elevated liver enzymes 02/27/2013   AUG 2014 GGT 470 ALT 41 AST 32 ALK PHOS 275   . Fibromyalgia   . HTN (hypertension)   . Lumbago   . Migraine   . Myalgia and myositis, unspecified   . Osteoporosis   . PONV (postoperative nausea and vomiting)   . Recurrent UTI    specialist at [facility]  . Rheumatoid arthritis(714.0)    sepcialist at [facility], Dr. Lynwood Dawley  . Sinusitis   . TIA (transient ischemic attack) 1991, 2006    Past Surgical History:  Procedure Laterality Date  . BACK SURGERY     lower  . COLONOSCOPY  2003   Dr. Rehman--> ischemic colitis  . COLONOSCOPY WITH PROPOFOL  06/04/2012   SLF:A sessile polyp measuring 3 mm   . ESOPHAGOGASTRODUODENOSCOPY (EGD) WITH ESOPHAGEAL DILATION  06/17/2012   LEX:[id number] ring was found at the gastroesophageal junction/Non-erosive gastritis (inflammation)  . ESOPHAGOGASTRODUODENOSCOPY (EGD) WITH PROPOFOL  06/04/2012   BSW:HQPRFFMB ring was found at the gastroesophageal junction/SIGMOID ESOPHAGUS(DISTAL)/Non-erosive gastritis (inflammation)  . NASAL SINUS SURGERY     x3  . PARTIAL HYSTERECTOMY    . POLYPECTOMY  06/04/2012   Procedure: POLYPECTOMY;  Surgeon: Danie Binder, MD;  Location: [facility];  Service: Endoscopy;  Laterality: N/A;  . ROTATOR CUFF REPAIR    . SAVORY DILATION  06/04/2012   Procedure: SAVORY DILATION;  Surgeon: Danie Binder, MD;  Location: [facility];  Service: Endoscopy;  Laterality: N/A;  70m, 12.8 mm, 14 mm  . SHOULDER SURGERY     left  . TONSILLECTOMY    . TUBAL LIGATION      Prior to Admission medications   Medication Sig Start Date End Date Taking? Authorizing Provider   acetaminophen (TYLENOL) 500 MG tablet Take 1,000 mg by mouth every 8 (eight) hours as needed for moderate pain.    Yes [provider]  ALPRAZolam (Duanne Moron 0.5 MG tablet Take 0.5 mg by mouth 3 (three) times daily as needed for anxiety.  05/01/12  Yes [provider]  amLODipine (NORVASC) 10 MG tablet Take 10 mg by mouth daily.  05/07/12  Yes [provider]  atenolol (TENORMIN) 50 MG tablet Take 50 mg by mouth daily.    Yes [provider]  clobetasol cream (TEMOVATE) 08.46% Apply 1 application topically 2 (two) times daily as needed (rash).  04/24/12  Yes [provider]  CYMBALTA 60 MG capsule Take 60 mg by mouth daily.  03/08/12  Yes [provider]  hydroxychloroquine (PLAQUENIL) 200 MG tablet Take 200 mg by mouth 2 (two) times daily.  05/07/12  Yes [provider]  hydroxypropyl methylcellulose / hypromellose (ISOPTO TEARS / GONIOVISC) 2.5 % ophthalmic solution Place 1 drop into both eyes 3 (three) times daily as needed for dry eyes.   Yes [provider]  LINZESS 145 MCG CAPS capsule TAKE 1 CAPSULE BY MOUTH EVERY DAY 30 MINUTES PRIOR TO YOUR FIRST MEAL Patient taking differently: Take 145 mcg by mouth daily before breakfast.  07/30/15  Yes GCarlis Stable NP  meloxicam (MOBIC) 15 MG tablet Take 15  mg by mouth daily.  07/31/16  Yes [provider]  Oxycodone HCl 10 MG TABS Take 10 mg by mouth every 8 (eight) hours as needed for pain.    Yes [provider]  pantoprazole (PROTONIX) 40 MG tablet TAKE 1 TABLET BY MOUTH TWICE DAILY 30 MINUTES PRIOR TO BREAKFAST AND DINNER Patient taking differently: Take 40 mg by mouth 2 (two) times daily before a meal. TAKE 1 TABLET BY MOUTH TWICE DAILY 30 MINUTES PRIOR TO BREAKFAST AND DINNER Sometimes takes bid 03/04/18  Yes Annitta Needs, NP  pregabalin (LYRICA) 50 MG capsule Take 50 mg by mouth daily.   Yes [provider]  tiZANidine (ZANAFLEX) 4 MG tablet Take 4 mg by  mouth 3 (three) times daily as needed for muscle spasms.  05/07/12  Yes [provider]  Tofacitinib Citrate (XELJANZ) 5 MG TABS Take 5 mg by mouth 2 (two) times daily.    Yes [provider]  traZODone (DESYREL) 50 MG tablet Take 50 mg by mouth at bedtime as needed for sleep. 03/26/15  Yes [provider]  linaclotide (LINZESS) 145 MCG CAPS capsule TAKE ONE CAPSULE BY MOUTH EVERY DAY 30 MINUTES BEFORE THE FIRST MEAL OF THE DAY Patient not taking: Reported on 09/23/2018 03/04/18   Annitta Needs, NP    Allergies as of 07/29/2018 - Review Complete 06/07/2018  Allergen Reaction Noted  . Carafate [sucralfate]  10/01/2014  . Compazine [prochlorperazine edisylate] Other (See Comments) 05/22/2012  . Sulfa antibiotics Nausea And Vomiting 05/22/2012    Family History  Problem Relation Age of Onset  . Diabetes Father   . Kidney failure Father   . Breast cancer Mother   . Stroke Mother   . Diabetes Other        siblings  . Lymphoma Sister   . Liver disease Other        history of elevated LFTs in sister too  . Colon cancer Neg Hx     Social History   Socioeconomic History  . Marital status: Married    Spouse name: Not on file  . Number of children: 1  . Years of education: Not on file  . Highest education level: Not on file  Occupational History  . Occupation: disability    Employer: UNEMPLOYED  Social Needs  . Financial resource strain: Not on file  . Food insecurity:    Worry: Not on file    Inability: Not on file  . Transportation needs:    Medical: Not on file    Non-medical: Not on file  Tobacco Use  . Smoking status: Never Smoker  . Smokeless tobacco: Never Used  Substance and Sexual Activity  . Alcohol use: No  . Drug use: No  . Sexual activity: Not on file  Lifestyle  . Physical activity:    Days per week: Not on file    Minutes per session: Not on file  . Stress: Not on file  Relationships  . Social connections:    Talks on phone: Not  on file    Gets together: Not on file    Attends religious service: Not on file    Active member of club or organization: Not on file    Attends meetings of clubs or organizations: Not on file    Relationship status: Not on file  . Intimate partner violence:    Fear of current or ex partner: Not on file    Emotionally abused: Not on file  Physically abused: Not on file    Forced sexual activity: Not on file  Other Topics Concern  . Not on file  Social History Narrative  . Not on file    Review of Systems: See HPI, otherwise negative ROS   Physical Exam: BP (!) 165/85   Pulse 69   Temp 97.9 F (36.6 C) (Oral)   Resp (!) 22   Ht 5' 1"  (1.549 m)   Wt 63.5 kg   SpO2 98%   BMI 26.45 kg/m  General:   Alert,  pleasant and cooperative in NAD Head:  Normocephalic and atraumatic. Neck:  Supple; Lungs:  Clear throughout to auscultation.    Heart:  Regular rate and rhythm. Abdomen:  Soft, nontender and nondistended. Normal bowel sounds, without guarding, and without rebound.   Neurologic:  Alert and  oriented x4;  grossly normal neurologically.  Impression/Plan:     DYSPHAGIA  PLAN:  EGD/DIL TODAY. DISCUSSED PROCEDURE, BENEFITS, & RISKS: < 1% chance of medication reaction, bleeding, perforation, or ASPIRATION.

## 2018-10-02 ENCOUNTER — Telehealth: Payer: Self-pay

## 2018-10-02 NOTE — Telephone Encounter (Signed)
Pt is aware and will try.

## 2018-10-02 NOTE — Telephone Encounter (Signed)
Unlikely side effect of anesthesia used. She could consider using Xanax she has available to her as prescribed. Could consider Benadryl 25mg  at bedtime to help sleep tonight if needed.

## 2018-10-02 NOTE — Telephone Encounter (Signed)
Pt called and said that she had EGD yesterday and was not able to sleep last night. She said she has not been able to go to sleep today either. She has no other complaints, except feeling jittery from not sleeping. Forwarding to Neil Crouch, PA in Dr. Oneida Alar absence.

## 2018-10-03 ENCOUNTER — Encounter (HOSPITAL_COMMUNITY): Payer: Self-pay | Admitting: Gastroenterology

## 2018-10-03 ENCOUNTER — Encounter: Payer: Self-pay | Admitting: Gastroenterology

## 2018-12-23 ENCOUNTER — Telehealth: Payer: Self-pay | Admitting: Gastroenterology

## 2018-12-23 NOTE — Telephone Encounter (Signed)
Recall for mri °

## 2018-12-23 NOTE — Telephone Encounter (Signed)
Letter mailed

## 2019-02-04 ENCOUNTER — Ambulatory Visit: Payer: Medicare Other | Admitting: Gastroenterology

## 2019-03-10 ENCOUNTER — Other Ambulatory Visit: Payer: Self-pay

## 2019-03-10 ENCOUNTER — Ambulatory Visit (INDEPENDENT_AMBULATORY_CARE_PROVIDER_SITE_OTHER): Payer: Medicare Other | Admitting: Gastroenterology

## 2019-03-10 ENCOUNTER — Encounter: Payer: Self-pay | Admitting: Gastroenterology

## 2019-03-10 VITALS — BP 141/81 | HR 64 | Temp 96.2°F | Ht 61.0 in | Wt 143.2 lb

## 2019-03-10 DIAGNOSIS — N289 Disorder of kidney and ureter, unspecified: Secondary | ICD-10-CM

## 2019-03-10 DIAGNOSIS — R1319 Other dysphagia: Secondary | ICD-10-CM

## 2019-03-10 DIAGNOSIS — K5900 Constipation, unspecified: Secondary | ICD-10-CM | POA: Diagnosis not present

## 2019-03-10 DIAGNOSIS — K2100 Gastro-esophageal reflux disease with esophagitis, without bleeding: Secondary | ICD-10-CM

## 2019-03-10 DIAGNOSIS — R1310 Dysphagia, unspecified: Secondary | ICD-10-CM

## 2019-03-10 DIAGNOSIS — K219 Gastro-esophageal reflux disease without esophagitis: Secondary | ICD-10-CM | POA: Insufficient documentation

## 2019-03-10 DIAGNOSIS — R1013 Epigastric pain: Secondary | ICD-10-CM | POA: Diagnosis not present

## 2019-03-10 NOTE — Assessment & Plan Note (Signed)
Due for MRI left indeterminate renal lesion. She wants to postpone until 04/2019 for insurance reasons. Will remind her at that time.

## 2019-03-10 NOTE — Patient Instructions (Addendum)
1. Increase pantoprazole to twice daily before a meal.  2. Continue Linzess as you are doing for management of your constipation. 3. Let me know if you decide to pursue xray of your esophagus for your swallowing concerns. You may require avoiding foods and liquids that are difficult for you to get down due to your esophageal motility issues (aging esophageal muscles).  4. Plan for MRI of your kidney lesion in 04/2019.  5. Return to the office in six months.

## 2019-03-10 NOTE — Assessment & Plan Note (Signed)
Overall improved s/p esophageal dilation 09/2018. 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 Continue to monitor for now, avoid foods that cause issues, chew food thoroughly. Call if worsening symptoms, consider repeat BPE to check for esophageal stricture at that time.

## 2019-03-10 NOTE — Progress Notes (Signed)
Primary Care Physician: Sinda Du, MD  Primary Gastroenterologist:  Barney Drain, MD   Chief Complaint  Patient presents with  . Dysphagia    HPI: Sharon Spencer is a 68 y.o. female here for follow up. She was last seen at time of EGD in 09/2018. 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   Colonoscopy 05/2012, sessile polyp measuring 76mm removed (tubular adenoma). Next colonoscopy 05/2022. BPE 10/2014 for dysphagia showed tiny sliding hh, age-related esophageal dysmotility. No esophageal stricture.   H/o renal lesions, one left-sided renal lesion too small to characterize and MRI Abd with contrast recommended in 6-12 months, due at any time.   Today she reports esophageal dilation helped a lot but not 100%. She is very cautious with eating. Still cannot eat rice or drink carbonated beverages, can't get them down. Having gerd, digestion issues. Has to take just a few pills at a time. Does better with coated. Potassium has to crush. Heartburn reasonable well controlled but morning nausea and nausea with medications.  Linzess for constipation. Takes prn. No melena, brbpr. No abd pain. Takes pantoprazole once daily for heartburn. Some breakthrough heartburn. Complains of nausea when wakes up in the morning as well as with her morning medications.   She wants to postpone MRI kidneys until 04/2019 for insurance reasons.   Current Outpatient Medications  Medication Sig Dispense Refill  . acetaminophen (TYLENOL) 500 MG tablet Take 1,000 mg by mouth every 8 (eight) hours as needed for moderate pain.     Marland Kitchen ALPRAZolam (XANAX) 0.5 MG tablet Take 0.5 mg by mouth 3 (three) times daily as needed for anxiety.     Marland Kitchen amLODipine (NORVASC) 10 MG tablet Take 10 mg by mouth daily.     Marland Kitchen atenolol (TENORMIN) 50 MG tablet Take 50 mg by mouth daily.     . clobetasol cream (TEMOVATE) [id number] % Apply 1  application topically 2 (two) times daily as needed (rash).     . CYMBALTA 60 MG capsule Take 60 mg by mouth daily.     . hydroxychloroquine (PLAQUENIL) 200 MG tablet Take 200-400 mg by mouth 2 (two) times daily. Takes 400mg  bid Mon-Fri, 200mg  daily on Sat and Sun    . hydroxypropyl methylcellulose / hypromellose (ISOPTO TEARS / GONIOVISC) 2.5 % ophthalmic solution Place 1 drop into both eyes 3 (three) times daily as needed for dry eyes.    Marland Kitchen LINZESS 145 MCG CAPS capsule TAKE 1 CAPSULE BY MOUTH EVERY DAY 30 MINUTES PRIOR TO YOUR FIRST MEAL (Patient taking differently: Take 145 mcg by mouth daily before breakfast. ) 30 capsule 11  . oxyCODONE (ROXICODONE) 15 MG immediate release tablet Take 15 mg by mouth as needed for pain.    . pantoprazole (PROTONIX) 40 MG tablet TAKE 1 TABLET BY MOUTH TWICE DAILY 30 MINUTES PRIOR TO BREAKFAST AND DINNER (Patient taking differently: Take 40 mg by mouth 2 (two) times daily before a meal. TAKE 1 TABLET BY MOUTH TWICE DAILY 30 MINUTES PRIOR TO BREAKFAST AND DINNER Sometimes takes bid) 180 tablet 3  . tiZANidine (ZANAFLEX) 4 MG tablet Take 4 mg by mouth 3 (three) times daily as needed for muscle spasms.     . Tofacitinib Citrate (XELJANZ) 5 MG TABS Take 5 mg by mouth 2 (two) times daily.     . traZODone (DESYREL) 50 MG tablet Take 50 mg by mouth at bedtime  as needed for sleep.     No current facility-administered medications for this visit.    Facility-Administered Medications Ordered in Other Visits  Medication Dose Route Frequency Provider Last Rate Last Dose  . mineral oil light 100 % (sterile)    PRN Danie Binder, MD   1 application at [phone]    Allergies as of 03/10/2019 - Review Complete 03/10/2019  Allergen Reaction Noted  . Carafate [sucralfate]  10/01/2014  . Compazine [prochlorperazine edisylate] Other (See Comments) 05/22/2012  . Erythromycin Nausea And Vomiting 09/23/2018  . Sulfa antibiotics Nausea And Vomiting 05/22/2012    ROS:   General: Negative for anorexia, weight loss, fever, chills, fatigue, weakness. ENT: Negative for hoarseness,  nasal congestion. CV: Negative for chest pain, angina, palpitations, dyspnea on exertion, peripheral edema.  Respiratory: Negative for dyspnea at rest, dyspnea on exertion, cough, sputum, wheezing.  GI: See history of present illness. GU:  Negative for dysuria, hematuria, urinary incontinence, urinary frequency, nocturnal urination.  Endo: Negative for unusual weight change.    Physical Examination:   BP (!) 141/81   Pulse 64   Temp (!) 96.2 F (35.7 C) (Temporal)   Ht 5\' 1"  (1.549 m)   Wt 143 lb 3.2 oz (65 kg)   BMI 27.06 kg/m   General: Well-nourished, well-developed in no acute distress.  Eyes: No icterus. Mouth: masked.  Abdomen: Bowel sounds are normal, nontender, nondistended, no hepatosplenomegaly or masses, no abdominal bruits or hernia , no rebound or guarding.   Extremities: No lower extremity edema. No clubbing or deformities. Neuro: Alert and oriented x 4   Skin: Warm and dry, no jaundice.   Psych: Alert and cooperative, normal mood and affect.  Labs:  12/2018 labs at [facility]: H/H 12.3/38.6, AST 35, ALT 32, AP 111, Tbili 0.7, ferritin 61  Imaging Studies: No results found.

## 2019-03-10 NOTE — Assessment & Plan Note (Addendum)
Continue Linzess 147mcg daily as needed.

## 2019-03-10 NOTE — Assessment & Plan Note (Signed)
Some breakthrough symptoms. Early morning nausea, ?variant of her reflux. Will increase pantoprazole to BID for next few weeks, 30 minutes prior to meal, to see if any improvement. Reinforced antireflux measures. Call if no improvement in symptoms.

## 2019-03-29 ENCOUNTER — Other Ambulatory Visit: Payer: Self-pay | Admitting: Gastroenterology

## 2019-04-03 ENCOUNTER — Telehealth: Payer: Self-pay | Admitting: Gastroenterology

## 2019-04-03 DIAGNOSIS — N289 Disorder of kidney and ureter, unspecified: Secondary | ICD-10-CM

## 2019-04-03 NOTE — Telephone Encounter (Signed)
Patient wanted to put of MRI Abd with contrast (evaluate left renal lesion) UNTIL 04/2019 DUE TO INSURANCE REASON.   CAN WE GET HER ON LIST TO CALL NEXT MONTH OR AT LEAST UPDATE HER NIC IN COMPUTER SO IT COMES UP AGAIN FOR NEXT MONTH?

## 2019-04-04 NOTE — Telephone Encounter (Signed)
fowarding to [facility] to [place]

## 2019-04-08 ENCOUNTER — Telehealth: Payer: Self-pay | Admitting: Gastroenterology

## 2019-04-08 NOTE — Addendum Note (Signed)
Addended by: Cheron Every on: 04/08/2019 10:19 AM   Modules accepted: Orders

## 2019-04-08 NOTE — Telephone Encounter (Signed)
PATIENT ON RECALL FOR MRI IN October

## 2019-04-08 NOTE — Telephone Encounter (Signed)
I HAVE ALREADY STARTED SENDING LETTERS FOR October RECALL, I WILL JUST PRINT HER ANOTHER LETTER

## 2019-04-08 NOTE — Telephone Encounter (Signed)
Mri ABD WITH CONTRAST scheduled for 10/26 at 10:00am, arrival 9:30am, npo midnight   Called patient and is aware of appt details. Letter mailed

## 2019-04-08 NOTE — Telephone Encounter (Signed)
See prior note

## 2019-05-12 ENCOUNTER — Ambulatory Visit (HOSPITAL_COMMUNITY)
Admission: RE | Admit: 2019-05-12 | Discharge: 2019-05-12 | Disposition: A | Discharge status: Home / Self Care | Payer: Medicare Other | Source: Ambulatory Visit | Attending: Gastroenterology | Admitting: Gastroenterology

## 2019-05-12 ENCOUNTER — Other Ambulatory Visit: Payer: Self-pay

## 2019-05-12 DIAGNOSIS — N289 Disorder of kidney and ureter, unspecified: Secondary | ICD-10-CM | POA: Insufficient documentation

## 2019-05-12 LAB — POCT I-STAT CREATININE: Creatinine, Ser: 1.2 mg/dL — ABNORMAL HIGH (ref 0.44–1.00)

## 2019-05-12 MED ORDER — GADOBUTROL 1 MMOL/ML IV SOLN
7.0000 mL | Freq: Once | INTRAVENOUS | Status: AC | PRN
  Administered 2019-05-12: 7 mL via INTRAVENOUS

## 2019-07-23 ENCOUNTER — Encounter: Payer: Self-pay | Admitting: Gastroenterology

## 2019-07-23 LAB — HM DIABETES EYE EXAM

## 2019-08-19 ENCOUNTER — Other Ambulatory Visit: Payer: Self-pay | Admitting: Gastroenterology

## 2019-08-19 ENCOUNTER — Encounter: Payer: Self-pay | Admitting: Gastroenterology

## 2019-08-19 ENCOUNTER — Telehealth: Payer: Self-pay

## 2019-08-19 DIAGNOSIS — K5900 Constipation, unspecified: Secondary | ICD-10-CM

## 2019-08-19 MED ORDER — LINACLOTIDE 145 MCG PO CAPS: 145.0000 ug | ORAL_CAPSULE | Freq: Every day | ORAL | 3 refills | Status: DC

## 2019-08-19 NOTE — Telephone Encounter (Signed)
Pt called to change her pharmacy. [facility] [place], [place] has been added to pts pharmacy list. Pt would like Linzess 145 mcg sent to this pharmacy.    If a PA is needed, Lynda Rainwater is the insurance plan that will be filled, not [employer] per pt.

## 2019-08-19 NOTE — Telephone Encounter (Signed)
Rx Sent  

## 2019-08-19 NOTE — Telephone Encounter (Signed)
Noted. Pt notified that RX was sent to her pharmacy.

## 2019-09-23 ENCOUNTER — Encounter: Payer: Self-pay | Admitting: Gastroenterology

## 2019-10-15 ENCOUNTER — Encounter: Payer: Self-pay | Admitting: Gastroenterology

## 2019-10-15 ENCOUNTER — Ambulatory Visit (INDEPENDENT_AMBULATORY_CARE_PROVIDER_SITE_OTHER): Payer: Medicare Other | Admitting: Gastroenterology

## 2019-10-15 ENCOUNTER — Other Ambulatory Visit: Payer: Self-pay

## 2019-10-15 VITALS — BP 123/70 | HR 66 | Temp 97.0°F | Ht 61.0 in | Wt 156.6 lb

## 2019-10-15 DIAGNOSIS — K219 Gastro-esophageal reflux disease without esophagitis: Secondary | ICD-10-CM

## 2019-10-15 DIAGNOSIS — K5900 Constipation, unspecified: Secondary | ICD-10-CM

## 2019-10-15 NOTE — Patient Instructions (Addendum)
1. Take your pantoprazole 40mg  before breakfast and supper.  2. Take Pepcid (famotidine) at bedtime.  3. Please do not eat or drink within two hours of laying down at night.  4. Try a pillow wedge, you can find a local pharmacy. This can help keep your torso elevated and keep the reflux down.  5. Call if couple of weeks and let me know how your are doing.  6. Continue Linzess daily for constipation.  7. I will review your records and let you know if anything else is needed.  8. Return to office in 3 months or sooner if needed.

## 2019-10-15 NOTE — Progress Notes (Signed)
    Primary Care Physician: Namba, Alexa Kathleen, DO  Primary Gastroenterologist:  Sandi Fields, MD   Chief Complaint  Patient presents with  . Gastroesophageal Reflux    f/u. "heartburn" worse at night even without eating will acid come back up in throat.    HPI: Sharon Spencer is a 68 y.o. female here for follow-up of GERD.  She was last seen in August 2020.  She had a EGD March 2020 noted to have a small hiatal hernia,benign appearing peptic stricture s/p esophageal dilation, gastritis (likely due to Mobic), LA grade B esophagitis.   Colonoscopy 05/2012, sessile polyp measuring 3mm removed (tubular adenoma). Next colonoscopy 05/2022. BPE 10/2014 for dysphagia showed tiny sliding hh, age-related esophageal dysmotility. No esophageal stricture.  Patient's weight is up 16 pounds in the past year.  She complains of heartburn most evenings before going to bed.  Often wakes up with regurgitation, choking trying to get rid of secretions.  Positive proper self up on 2 pillows at night.  States the head of her bed is raised on break.  Often has a light snack before going to bed such as Jell-O or popsicle.  Takes pantoprazole 40 mg every morning.  She only remembers to take her pantoprazole before her evening meal about 50% of the time.  When she wakes up with reflux, she will usually take something over-the-counter.  Recently obtain famotidine but has not tried it yet.  Continues to have some swallowing issues but much better after dilatation.  She finally chops for me.  She has had some choking spells while eating, has to hit herself on the chest to make it better.  She has some abdominal pain worse with meals.  Bowel movements doing okay on Linzess 145 mcg daily.  No blood in the stool or melena.  Continues to follow with her rheumatologist for RA and cardiologist.  Current Outpatient Medications  Medication Sig Dispense Refill  . acetaminophen (TYLENOL) 500 MG tablet Take 1,000 mg by mouth  every 8 (eight) hours as needed for moderate pain.     . ALPRAZolam (XANAX) 0.5 MG tablet Take 0.5 mg by mouth 3 (three) times daily as needed for anxiety.     . amLODipine (NORVASC) 10 MG tablet Take 10 mg by mouth daily.     . atenolol (TENORMIN) 50 MG tablet Take 100 mg by mouth daily.     . clobetasol cream (TEMOVATE) 0.05 % Apply 1 application topically 2 (two) times daily as needed (rash).     . conjugated estrogens (PREMARIN) vaginal cream Place 1 Applicatorful vaginally daily.    . CYMBALTA 60 MG capsule Take 60 mg by mouth 2 (two) times daily.     . diclofenac Sodium (VOLTAREN) 1 % GEL Apply topically as needed.    . famotidine (PEPCID) 20 MG tablet Take 20 mg by mouth 2 (two) times daily as needed for heartburn or indigestion.    . hydroxychloroquine (PLAQUENIL) 200 MG tablet Take 200-400 mg by mouth 2 (two) times daily. Takes 400mg bid Mon-Fri, 200mg daily on Sat and Sun    . linaclotide (LINZESS) 145 MCG CAPS capsule Take 1 capsule (145 mcg total) by mouth daily before breakfast. (Patient taking differently: Take 145 mcg by mouth daily as needed. ) 90 capsule 3  . montelukast (SINGULAIR) 10 MG tablet Take 10 mg by mouth at bedtime.    . Oxycodone HCl 20 MG TABS Take 20 mg by mouth as needed for pain.     .   pantoprazole (PROTONIX) 40 MG tablet TAKE 1 TABLET BY MOUTH TWICE DAILY 30 MINUTES PRIOR TO BREAKFAST AND DINNER (Patient taking differently: Take 40 mg by mouth daily. ) 180 tablet 3  . tiZANidine (ZANAFLEX) 2 MG tablet Take 2 mg by mouth daily.     . Tofacitinib Citrate (XELJANZ) 5 MG TABS Take 10 mg by mouth daily.     . traZODone (DESYREL) 50 MG tablet Take 50 mg by mouth at bedtime as needed for sleep.     No current facility-administered medications for this visit.   Facility-Administered Medications Ordered in Other Visits  Medication Dose Route Frequency Provider Last Rate Last Admin  . mineral oil light 100 % (sterile)    PRN Fields, Sandi L, MD   1 application at  06/04/12 1345    Allergies as of 10/15/2019 - Review Complete 10/15/2019  Allergen Reaction Noted  . Carafate [sucralfate]  10/01/2014  . Compazine [prochlorperazine edisylate] Other (See Comments) 05/22/2012  . Erythromycin Nausea And Vomiting 09/23/2018  . Sulfa antibiotics Nausea And Vomiting 05/22/2012    ROS:  General: Negative for anorexia, weight loss, fever, chills, fatigue, weakness. ENT: Negative for hoarseness, difficulty swallowing , nasal congestion. CV: Negative for chest pain, angina, palpitations, dyspnea on exertion, peripheral edema.  Respiratory: Negative for dyspnea at rest, dyspnea on exertion, cough, sputum, wheezing.  GI: See history of present illness. GU:  Negative for dysuria, hematuria, urinary incontinence, urinary frequency, nocturnal urination.  Endo: Negative for unusual weight change.    Physical Examination:   BP 123/70   Pulse 66   Temp (!) 97 F (36.1 C) (Oral)   Ht 5' 1" (1.549 m)   Wt 156 lb 9.6 oz (71 kg)   BMI 29.59 kg/m   General: Well-nourished, well-developed in no acute distress.  Eyes: No icterus. Mouth: masked Lungs: Clear to auscultation bilaterally.  Heart: Regular rate and rhythm, no murmurs rubs or gallops.  Abdomen: Bowel sounds are normal, nontender, nondistended, no hepatosplenomegaly or masses, no abdominal bruits or hernia , no rebound or guarding.   Extremities: No lower extremity edema. No clubbing or deformities. Neuro: Alert and oriented x 4   Skin: Warm and dry, no jaundice.   Psych: Alert and cooperative, normal mood and affect.  Labs:  Labs from June 2020: Total bilirubin 0.8, alk phos 129, AST 27, ALT 16, albumin 4.0, ferritin 61, hemoglobin 12.3, hematocrit 38.06, platelets 405,000, white blood cell count 15,000  Imaging Studies: No results found.  Impression/plan:  68-year-old female presenting for follow-up of reflux, constipation.  Reviewed extensive records from rheumatology and cardiology through  care everywhere.  GERD: Having nighttime breakthrough symptoms.  She is gained 16 pounds over the past year.  Forgets her evening PPI about 50% of the time.  Encouraged her to refrain from eating or drinking for at least 2 to 3 hours prior to laying down at night.  The more persistent taking pantoprazole prior to her evening meal.  Take famotidine 20 mg at bedtime.  Consider buying a pillow wedge to prop herself up further while sleeping.  Reinforced antireflux measures.  Call in a few weeks if no improvement in symptoms.  Constipation: Continue Linzess 145 mcg daily.  Return to the office in 3 months or sooner if needed. 

## 2019-10-21 ENCOUNTER — Encounter: Payer: Self-pay | Admitting: Gastroenterology

## 2019-10-21 NOTE — Progress Notes (Signed)
Cc'ed to pcp °

## 2020-01-21 ENCOUNTER — Ambulatory Visit: Payer: Medicare Other | Admitting: Gastroenterology

## 2020-01-21 NOTE — Progress Notes (Deleted)
Primary Care Physician: Franki Monte, DO  Primary Gastroenterologist:  Formerly Barney Drain, MD   No chief complaint on file.   HPI: Sharon Spencer is a 69 y.o. female here for follow-up of GERD, constipation.  Last seen in March 2021.  She had a EGD March 2020 noted to have a small hiatal hernia,benign appearing peptic stricture s/p esophageal dilation, gastritis (likely due to Mobic), LA grade B esophagitis.   Colonoscopy 05/2012, sessile polyp measuring 39mm removed (tubular adenoma). Next colonoscopy 05/2022. BPE 10/2014 for dysphagia showed tiny sliding hh, age-related esophageal dysmotility. No esophageal stricture.  Patient has a history of nocturnal reflux in the setting of weight gain, missing her nighttime PPI 50% of the time. Current Outpatient Medications  Medication Sig Dispense Refill   acetaminophen (TYLENOL) 500 MG tablet Take 1,000 mg by mouth every 8 (eight) hours as needed for moderate pain.      ALPRAZolam (XANAX) 0.5 MG tablet Take 0.5 mg by mouth 3 (three) times daily as needed for anxiety.      amLODipine (NORVASC) 10 MG tablet Take 10 mg by mouth daily.      atenolol (TENORMIN) 50 MG tablet Take 100 mg by mouth daily.      clobetasol cream (TEMOVATE) 0.93 % Apply 1 application topically 2 (two) times daily as needed (rash).      conjugated estrogens (PREMARIN) vaginal cream Place 1 Applicatorful vaginally daily.     CYMBALTA 60 MG capsule Take 60 mg by mouth 2 (two) times daily.      diclofenac Sodium (VOLTAREN) 1 % GEL Apply topically as needed.     famotidine (PEPCID) 20 MG tablet Take 20 mg by mouth at bedtime.      hydroxychloroquine (PLAQUENIL) 200 MG tablet Take 200-400 mg by mouth 2 (two) times daily. Takes 400mg  bid Mon-Fri, 200mg  daily on Sat and Sun     linaclotide (LINZESS) 145 MCG CAPS capsule Take 1 capsule (145 mcg total) by mouth daily before breakfast. (Patient taking differently: Take 145 mcg by mouth daily as needed.  ) 90 capsule 3   montelukast (SINGULAIR) 10 MG tablet Take 10 mg by mouth at bedtime.     Oxycodone HCl 20 MG TABS Take 20 mg by mouth as needed for pain.      pantoprazole (PROTONIX) 40 MG tablet TAKE 1 TABLET BY MOUTH TWICE DAILY 30 MINUTES PRIOR TO BREAKFAST AND DINNER 180 tablet 3   tiZANidine (ZANAFLEX) 2 MG tablet Take 2 mg by mouth daily.      Tofacitinib Citrate (XELJANZ) 5 MG TABS Take 10 mg by mouth daily.      traZODone (DESYREL) 50 MG tablet Take 50 mg by mouth at bedtime as needed for sleep.     No current facility-administered medications for this visit.   Facility-Administered Medications Ordered in Other Visits  Medication Dose Route Frequency Provider Last Rate Last Admin   mineral oil light 100 % (sterile)    PRN Danie Binder, MD   1 application at 23/55/73 1345    Allergies as of 01/21/2020 - Review Complete 10/15/2019  Allergen Reaction Noted   Carafate [sucralfate]  10/01/2014   Compazine [prochlorperazine edisylate] Other (See Comments) 05/22/2012   Erythromycin Nausea And Vomiting 09/23/2018   Sulfa antibiotics Nausea And Vomiting 05/22/2012    ROS:  General: Negative for anorexia, weight loss, fever, chills, fatigue, weakness. ENT: Negative for hoarseness, difficulty swallowing , nasal congestion. CV: Negative for chest pain, angina, palpitations,  dyspnea on exertion, peripheral edema.  Respiratory: Negative for dyspnea at rest, dyspnea on exertion, cough, sputum, wheezing.  GI: See history of present illness. GU:  Negative for dysuria, hematuria, urinary incontinence, urinary frequency, nocturnal urination.  Endo: Negative for unusual weight change.    Physical Examination:   There were no vitals taken for this visit.  General: Well-nourished, well-developed in no acute distress.  Eyes: No icterus. Mouth: Oropharyngeal mucosa moist and pink , no lesions erythema or exudate. Lungs: Clear to auscultation bilaterally.  Heart: Regular rate  and rhythm, no murmurs rubs or gallops.  Abdomen: Bowel sounds are normal, nontender, nondistended, no hepatosplenomegaly or masses, no abdominal bruits or hernia , no rebound or guarding.   Extremities: No lower extremity edema. No clubbing or deformities. Neuro: Alert and oriented x 4   Skin: Warm and dry, no jaundice.   Psych: Alert and cooperative, normal mood and affect.  Labs:  ***  Imaging Studies: No results found.

## 2020-03-15 ENCOUNTER — Ambulatory Visit: Payer: Medicare Other | Admitting: Gastroenterology

## 2020-04-02 ENCOUNTER — Encounter: Payer: Self-pay | Admitting: Gastroenterology

## 2020-04-02 ENCOUNTER — Other Ambulatory Visit: Payer: Self-pay

## 2020-04-02 ENCOUNTER — Ambulatory Visit (INDEPENDENT_AMBULATORY_CARE_PROVIDER_SITE_OTHER): Payer: Medicare Other | Admitting: Gastroenterology

## 2020-04-02 VITALS — BP 170/90 | HR 64 | Temp 96.8°F | Ht 61.0 in | Wt 154.0 lb

## 2020-04-02 DIAGNOSIS — R1310 Dysphagia, unspecified: Secondary | ICD-10-CM

## 2020-04-02 DIAGNOSIS — K5900 Constipation, unspecified: Secondary | ICD-10-CM | POA: Diagnosis not present

## 2020-04-02 DIAGNOSIS — K219 Gastro-esophageal reflux disease without esophagitis: Secondary | ICD-10-CM

## 2020-04-02 NOTE — Progress Notes (Signed)
Primary Care Physician: Franki Monte, DO  Primary Gastroenterologist:  Elon Alas. Abbey Chatters, DO   Chief Complaint  Patient presents with  . Gastroesophageal Reflux    dry mouth, constipation    HPI: Sharon Spencer is a 69 y.o. female here for follow up GERD, constipation. Last seen in 09/2019. She had a EGD March 2020 noted to have a small hiatal hernia,benign appearing peptic stricture s/p esophageal dilation, gastritis (likely due to Mobic), LA grade B esophagitis. Colonoscopy 05/2012, sessile polyp measuring 42mm removed (tubular adenoma). Next colonoscopy 05/2022. BPE 10/2014 for dysphagia showed tiny sliding hh, age-related esophageal dysmotility. No esophageal stricture.  Changed RA meds.  Now on Actemra infusion monthly.her rheumatologist is leaving and she will start seeing an associate, Dr. Lyndel Safe.  Also recently had received several epidurals in her back for pain.  Having a lot of dental issues due to her history of osteoporosis, deteriorating teeth.    Denies any unintentional weight loss.  Her food choices are not the best.  She is eating soft foods because of her dental issues.  Sometimes has problems swallowing only occasionally.  Feels like the food or liquid gets stuck in between her throat and her esophagus and when this happens she feels like she cannot breathe.  Last for a few seconds and then will either vomit the food or the food passes.  Right now she does not want to pursue any work-up unless symptoms worsen.  Would offer her a barium pill esophagram as next step.  Constipation reasonably well controlled with Linzess.  She takes Linzess since she has a good results, she will skip several days of the medication.  Sometimes she takes Linzess and does not have good bowel movements may even have [place] 1 through 3.  No melena or rectal bleeding.  Reflux is okay.  Symptoms fairly well controlled on pantoprazole 40 mg before breakfast and supper, Pepcid 20 mg at  nighttime for breakthrough symptoms.  She is trying to wait at least 3 hours between eating and laying down.     Wt Readings from Last 3 Encounters:  04/02/20 154 lb (69.9 kg)  10/15/19 156 lb 9.6 oz (71 kg)  03/10/19 143 lb 3.2 oz (65 kg)     Current Outpatient Medications  Medication Sig Dispense Refill  . acetaminophen (TYLENOL) 500 MG tablet Take 1,000 mg by mouth as needed for moderate pain.     Marland Kitchen ALPRAZolam (XANAX) 0.5 MG tablet Take 0.5 mg by mouth 3 (three) times daily as needed for anxiety.     Marland Kitchen amLODipine (NORVASC) 10 MG tablet Take 10 mg by mouth daily.     Marland Kitchen atenolol (TENORMIN) 50 MG tablet Take 100 mg by mouth daily.     Marland Kitchen conjugated estrogens (PREMARIN) vaginal cream Place 1 Applicatorful vaginally daily.    . CYMBALTA 60 MG capsule Take 60 mg by mouth 2 (two) times daily.     . diclofenac Sodium (VOLTAREN) 1 % GEL Apply topically as needed.    . famotidine (PEPCID) 20 MG tablet Take 20 mg by mouth at bedtime.     . hydroxychloroquine (PLAQUENIL) 200 MG tablet Take 200-400 mg by mouth 2 (two) times daily. Takes 400mg  bid Mon-Fri, 200mg  daily on Sat and Sun    . linaclotide (LINZESS) 145 MCG CAPS capsule Take 1 capsule (145 mcg total) by mouth daily before breakfast. (Patient taking differently: Take 145 mcg by mouth daily as needed. ) 90 capsule 3  .  montelukast (SINGULAIR) 10 MG tablet Take 10 mg by mouth as needed.     . Oxycodone HCl 20 MG TABS Take 15 mg by mouth as needed for pain.     . pantoprazole (PROTONIX) 40 MG tablet TAKE 1 TABLET BY MOUTH TWICE DAILY 30 MINUTES PRIOR TO BREAKFAST AND DINNER 180 tablet 3  . tiZANidine (ZANAFLEX) 2 MG tablet Take 2 mg by mouth daily.     Marland Kitchen tocilizumab (ACTEMRA) 400 MG/20ML SOLN injection Actemra 400 mg/20 mL (20 mg/mL) intravenous solution  Infuse 4 mg/kg once every 4 weeksWt: 151 lbs--> 270mg     . traZODone (DESYREL) 50 MG tablet Take 50 mg by mouth at bedtime as needed for sleep.     No current facility-administered  medications for this visit.   Facility-Administered Medications Ordered in Other Visits  Medication Dose Route Frequency Provider Last Rate Last Admin  . mineral oil light 100 % (sterile)    PRN Danie Binder, MD   1 application at 02/28/47 1345    Allergies as of 04/02/2020 - Review Complete 04/02/2020  Allergen Reaction Noted  . Carafate [sucralfate]  10/01/2014  . Compazine [prochlorperazine edisylate] Other (See Comments) 05/22/2012  . Erythromycin Nausea And Vomiting 09/23/2018  . Sulfa antibiotics Nausea And Vomiting 05/22/2012    ROS:  General: Negative for anorexia, weight loss, fever, chills, fatigue, weakness. ENT: Negative for hoarseness,  nasal congestion.  See HPI CV: Negative for chest pain, angina, palpitations, dyspnea on exertion, peripheral edema.  Respiratory: Negative for dyspnea at rest, dyspnea on exertion, cough, sputum, wheezing.  GI: See history of present illness. GU:  Negative for dysuria, hematuria, urinary incontinence, urinary frequency, nocturnal urination.  Endo: Negative for unusual weight change.    Physical Examination:   BP (!) 187/87   Pulse 64   Temp (!) 96.8 F (36 C) (Oral)   Ht 5\' 1"  (1.549 m)   Wt 154 lb (69.9 kg)   BMI 29.10 kg/m   General: Well-nourished, well-developed in no acute distress.  Eyes: No icterus. Mouth: masked Lungs: Clear to auscultation bilaterally.  Heart: Regular rate and rhythm, no murmurs rubs or gallops.  Abdomen: Bowel sounds are normal, nontender, nondistended, no hepatosplenomegaly or masses, no abdominal bruits or hernia , no rebound or guarding.   Extremities: No lower extremity edema. No clubbing or deformities. Neuro: Alert and oriented x 4   Skin: Warm and dry, no jaundice.   Psych: Alert and cooperative, normal mood and affect.   Imaging Studies: No results found.  Impression/plan:  69 year old female presenting for follow-up of GERD, constipation, dysphagia.  GERD: Doing better.  Taking  pantoprazole 40 mg twice daily before breakfast and evening meal.  Pepcid 20 mg at bedtime or with breakthrough symptoms at night.  Reinforced antireflux measures.  Return to the office in 6 months.  Dysphagia: Occasional.  History of peptic stricture requiring dilation in 2020.  If progressive symptoms, would consider barium pill esophagram as next step.  She will call if she wants to have this done.  Constipation: Continue Linzess 145 mcg daily.  At times, seems less effective.  She can take Linzess with food or take up to 2 of the 145 mcg capsules a day on occasion.  If she notices the need to take additional Linzess daily, we can increase her dose to 290 mcg daily.  Return to the office in 6 months or sooner if needed.

## 2020-04-02 NOTE — Patient Instructions (Signed)
1. Continue pantoprazole 40 mg twice daily before breakfast and dinner. 2. Continue famotidine 20 mg at bedtime as needed. 3. Continue Linzess 145 mcg daily as needed.  You can take with food to increase results.  On occasion you can double the dose, taking 2 within 1 day. 4. Let me know if you want to further evaluate your swallowing concerns with x-ray of your esophagus. 5. Return to the office in 6 months.

## 2020-09-10 ENCOUNTER — Other Ambulatory Visit: Payer: Self-pay | Admitting: Gastroenterology

## 2020-09-10 DIAGNOSIS — K5900 Constipation, unspecified: Secondary | ICD-10-CM

## 2020-09-30 NOTE — Progress Notes (Deleted)
Referring Provider: Franki Monte, * Primary Care Physician:  Franki Monte, DO Primary GI Physician: Dr. Rayne Du chief complaint on file.   HPI:   Sharon Spencer is a 70 y.o. female with history of elevated LFTs undergoing evaluation years ago including liver biopsy which was nonspecific but stated LFT elevation may be secondary to medication/obesity.  Relative lack of plasma cells argues against autoimmune disorder.  Notably, most recent LFTs on file November 2021 within normal limits. Also with history of GERD, dysphagia, dyspepsia, and constipation.  EGD March 2020 with LA grade B esophagitis, hiatal hernia, peptic stricture s/p dilation, gastritis due to Mobic.  Last colonoscopy in November 2013 with 3 mm tubular adenoma, recommended next colonoscopy in 2023.  She is presenting today for follow-up.  Last seen in our office 04/02/2020 for GERD, dysphagia, and constipation.  She is having dental issues and eating soft foods due to this.  Occasional swallowing problems feeling food or liquids sick in the back of her throat and esophagus.  She would either vomit or food with past within a few seconds.  Was not interested in pursuing further evaluation unless symptoms worsen.  GERD well controlled on Protonix 40 mg twice daily and Pepcid 20 mg at night. Linzess 145 mcg working fairly well for constipation.  She was advised to continue current medications, may take Linzess with food or up to 2 Linzess if needed, monitor for worsening dysphagia and let us know if this occurs.  Follow-up in 6 months.  Today:   Past Medical History:  Diagnosis Date  . Anxiety   . Arthritis    rheumatoid  . Carpal tunnel syndrome   . Depression   . Diabetes mellitus ([place])   . Elevated liver enzymes 02/27/2013   AUG 2014 GGT 470 ALT 41 AST 32 ALK PHOS 275   . Fibromyalgia   . HTN (hypertension)   . Lumbago   . Migraine   . Myalgia and myositis, unspecified   . Osteoporosis   . PONV  (postoperative nausea and vomiting)   . Recurrent UTI    specialist at [facility]  . Rheumatoid arthritis(714.0)    sepcialist at [facility], Dr. Lynwood Dawley  . Sinusitis   . TIA (transient ischemic attack) 1991, 2006    Past Surgical History:  Procedure Laterality Date  . BACK SURGERY     lower  . COLONOSCOPY  2003   Dr. Rehman--> ischemic colitis  . COLONOSCOPY WITH PROPOFOL  06/04/2012   SLF:A sessile polyp measuring 3 mm   . ESOPHAGOGASTRODUODENOSCOPY (EGD) WITH ESOPHAGEAL DILATION  06/17/2012   [id number] ring was found at the gastroesophageal junction/Non-erosive gastritis (inflammation)  . ESOPHAGOGASTRODUODENOSCOPY (EGD) WITH PROPOFOL  06/04/2012   [id number] ring was found at the gastroesophageal junction/SIGMOID ESOPHAGUS(DISTAL)/Non-erosive gastritis (inflammation)  . ESOPHAGOGASTRODUODENOSCOPY (EGD) WITH PROPOFOL N/A 10/01/2018   Dr. Oneida Alar: LA Grade B esophagitis, hh, peptic stricture s/p dilation, gastritis (due to Mobic)  . NASAL SINUS SURGERY     x3  . PARTIAL HYSTERECTOMY    . POLYPECTOMY  06/04/2012   Procedure: POLYPECTOMY;  Surgeon: Danie Binder, MD;  Location: [facility];  Service: Endoscopy;  Laterality: N/A;  . ROTATOR CUFF REPAIR    . SAVORY DILATION  06/04/2012   Procedure: SAVORY DILATION;  Surgeon: Danie Binder, MD;  Location: [facility];  Service: Endoscopy;  Laterality: N/A;  5m, 12.8 mm, 14 mm  . SAVORY DILATION N/A 10/01/2018   Procedure: SAVORY DILATION;  Surgeon: FOneida Alar  Marga Melnick, MD;  Location: [facility] SU[facility];  Service: Endoscopy;  Laterality: N/A;  . SHOULDER SURGERY     left  . TONSILLECTOMY    . TUBAL LIGATION      Current Outpatient Medications  Medication Sig Dispense Refill  . acetaminophen (TYLENOL) 500 MG tablet Take 1,000 mg by mouth as needed for moderate pain.     Marland Kitchen ALPRAZolam (XANAX) 0.5 MG tablet Take 0.5 mg by mouth 3 (three) times daily as needed for anxiety.     Marland Kitchen amLODipine (NORVASC) 10 MG tablet Take 10 mg by mouth daily.     Marland Kitchen  atenolol (TENORMIN) 50 MG tablet Take 100 mg by mouth daily.     Marland Kitchen conjugated estrogens (PREMARIN) vaginal cream Place 1 Applicatorful vaginally daily.    . CYMBALTA 60 MG capsule Take 60 mg by mouth 2 (two) times daily.     . diclofenac Sodium (VOLTAREN) 1 % GEL Apply topically as needed.    . famotidine (PEPCID) 20 MG tablet Take 20 mg by mouth at bedtime.     . hydroxychloroquine (PLAQUENIL) 200 MG tablet Take 200-400 mg by mouth 2 (two) times daily. Takes 450m bid Mon-Fri, 2086mdaily on Sat and Sun    . linaclotide (LINZESS) 145 MCG CAPS capsule Take 1 capsule (145 mcg total) by mouth daily as needed. 30 capsule 5  . montelukast (SINGULAIR) 10 MG tablet Take 10 mg by mouth as needed.     . Oxycodone HCl 20 MG TABS Take 15 mg by mouth as needed for pain.     . pantoprazole (PROTONIX) 40 MG tablet TAKE 1 TABLET BY MOUTH TWICE DAILY 30 MINUTES PRIOR TO BREAKFAST AND DINNER 180 tablet 3  . tiZANidine (ZANAFLEX) 2 MG tablet Take 2 mg by mouth daily.     . Marland Kitchenocilizumab (ACTEMRA) 400 MG/20ML SOLN injection Actemra 400 mg/20 mL (20 mg/mL) intravenous solution  Infuse 4 mg/kg once every 4 weeksWt: 151 lbs--> 27031m  . traZODone (DESYREL) 50 MG tablet Take 50 mg by mouth at bedtime as needed for sleep.     No current facility-administered medications for this visit.   Facility-Administered Medications Ordered in Other Visits  Medication Dose Route Frequency Provider Last Rate Last Admin  . mineral oil light 100 % (sterile)    PRN FieDanie BinderD   1 application at 11/38/45/3645    Allergies as of 10/01/2020 - Review Complete 04/02/2020  Allergen Reaction Noted  . Carafate [sucralfate]  10/01/2014  . Compazine [prochlorperazine edisylate] Other (See Comments) 05/22/2012  . Erythromycin Nausea And Vomiting 09/23/2018  . Sulfa antibiotics Nausea And Vomiting 05/22/2012    Family History  Problem Relation Age of Onset  . Diabetes Father   . Kidney failure Father   . Breast cancer Mother    . Stroke Mother   . Diabetes Other        siblings  . Lymphoma Sister   . Liver disease Other        history of elevated LFTs in sister too  . Colon cancer Neg Hx     Social History   Socioeconomic History  . Marital status: Married    Spouse name: Not on file  . Number of children: 1  . Years of education: Not on file  . Highest education level: Not on file  Occupational History  . Occupation: disability    Employer: UNEMPLOYED  Tobacco Use  . Smoking status: Never Smoker  . Smokeless tobacco:  Never Used  Vaping Use  . Vaping Use: Never used  Substance and Sexual Activity  . Alcohol use: No  . Drug use: No  . Sexual activity: Not on file  Other Topics Concern  . Not on file  Social History Narrative  . Not on file   Social Determinants of Health   Financial Resource Strain: Not on file  Food Insecurity: Not on file  Transportation Needs: Not on file  Physical Activity: Not on file  Stress: Not on file  Social Connections: Not on file    Review of Systems: Gen: Denies fever, chills, anorexia. Denies fatigue, weakness, weight loss.  CV: Denies chest pain, palpitations, syncope, peripheral edema, and claudication. Resp: Denies dyspnea at rest, cough, wheezing, coughing up blood, and pleurisy. GI: Denies vomiting blood, jaundice, and fecal incontinence.   Denies dysphagia or odynophagia. Derm: Denies rash, itching, dry skin Psych: Denies depression, anxiety, memory loss, confusion. No homicidal or suicidal ideation.  Heme: Denies bruising, bleeding, and enlarged lymph nodes.  Physical Exam: There were no vitals taken for this visit. General:   Alert and oriented. No distress noted. Pleasant and cooperative.  Head:  Normocephalic and atraumatic. Eyes:  Conjuctiva clear without scleral icterus. Mouth:  Oral mucosa pink and moist. Good dentition. No lesions. Heart:  S1, S2 present without murmurs appreciated. Lungs:  Clear to auscultation bilaterally. No  wheezes, rales, or rhonchi. No distress.  Abdomen:  +BS, soft, non-tender and non-distended. No rebound or guarding. No HSM or masses noted. Msk:  Symmetrical without gross deformities. Normal posture. Extremities:  Without edema. Neurologic:  Alert and  oriented x4 Psych:  Alert and cooperative. Normal mood and affect.

## 2020-10-01 ENCOUNTER — Ambulatory Visit: Payer: Medicare Other | Admitting: Gastroenterology

## 2020-10-20 NOTE — Progress Notes (Signed)
Referring Provider: Franki Monte, * Primary Care Physician:  Franki Monte, DO Primary GI Physician: Dr. Abbey Chatters  Chief Complaint  Patient presents with  . dark stool    Started last week  . Constipation    "little bit", takes Linzess prn  . Gastroesophageal Reflux    Pharmacy told her not to take Protonix d/t recall. Has been off Protonix for over 1 month    HPI:   Sharon Spencer is a 70 y.o. female presenting today with a history of elevated LFTs with normal serologic work-up and liver biopsy in 2014 nonspecific with subtle findings but could be secondary to medication/obesity, relative lack of plasma cells argued against autoimmune disorder. Most recent LFTs normal in April 2021. Also with history of GERD, dysphagia, and constipation. Colonoscopy 05/2012 with 1 tubular adenoma. Next colonoscopy 05/2022. EGD March 2020 noted to have a small hiatal hernia,benign appearing peptic stricture s/p esophageal dilation, gastritis (likely due to Mobic), LA grade B esophagitis. BPE 10/2014 for dysphagia showed tiny sliding hh, age-related esophageal dysmotility. No esophageal stricture.  Last seen in our office 04/02/2020.  Reported occasional swallowing trouble with sensation of food or liquids getting stuck between her throat and her esophagus.  Symptoms last a few seconds and she may vomit food or food passes. She was not interested in pursuing any additional work-up.  Constipation doing well fairly well Linzess 145 mcg, but occasionally, it was less effective.  Recommended taking Linzess with food or take up to 2 Linzess as needed.  Reflux doing okay with Protonix 40 mg twice daily and Pepcid 20 mg at night.  She was advised to continue her current medications.  Follow-up in 6 months.  Today: Started having dark stools last week.  Stools are not completely black, but very dark compared to her baseline.  Denies iron or Pepto-Bismol.  She has been taking a multivitamin with iron  and a vitamin for her nails and hair, but she has not taken this in over a month.  No dietary changes.  Reports having a bad sinus infection that led to bronchitis which she has been dealing with for the last couple of months.  She was on antibiotic and prednisone.  Bowel regularity fluctuates.  She may have bowel movements a couple days in a row and may skip a few days at a time.  She takes Linzess 145 mcg as needed, usually at least once a week. Used to have to take it daily. If she has not had a bowel movement in 3 days, she will take Linzess.  Usually, Linzess does not cause any problem for her, but when she took it 4 days ago she had abdominal cramping.  She does not take Linzess every day as it causes diarrhea. Reports change in stool caliber where stools have become more thin or she passes small soft pieces rather than full bowel movements.  States she cannot tolerate MiraLAX.  Denies unintentional weight loss or bright red blood in her stool.  She is not interested in a colonoscopy right now.  She has been having some discomfort in her lower abdomen after eating.  This improves if she can pass gas. Started about 1 month ago.  Usually lasts just a few minutes. If she has a BM, it helps. No identified triggers as it essentially occurs with any meal. Eats dairy daily- started Dannon yogurt shakes 3 weeks ago, cheese daily, occasional milk.   GERD: Reflux in the evenings.  She  is only taking famotidine at bedtime.  States her pharmacist told her Protonix was recalled so she stopped taking this.  When she was taking Protonix twice daily, she still has breakthrough symptoms about every other day.  Worse if eating something like pizza or spaghetti. Has been on nexium and prevacid in the past.  Dysphagia: Still with dysphagia. Occurs with rice, meats, and bread.  Foods get hung in the upper esophagus several times a week.  Typically has to throw the foods back up.   Past Medical History:  Diagnosis Date   . Anxiety   . Arthritis    rheumatoid  . Carpal tunnel syndrome   . Depression   . Diabetes mellitus ([place])   . Elevated liver enzymes 02/27/2013   AUG 2014 GGT 470 ALT 41 AST 32 ALK PHOS 275   . Fibromyalgia   . HTN (hypertension)   . Lumbago   . Migraine   . Myalgia and myositis, unspecified   . Osteoporosis   . PONV (postoperative nausea and vomiting)   . Recurrent UTI    specialist at [facility]  . Rheumatoid arthritis(714.0)    sepcialist at [facility], Dr. Lynwood Dawley  . Sinusitis   . TIA (transient ischemic attack) 1991, 2006    Past Surgical History:  Procedure Laterality Date  . BACK SURGERY     lower  . COLONOSCOPY  2003   Dr. Rehman--> ischemic colitis  . COLONOSCOPY WITH PROPOFOL  06/04/2012   SLF:A sessile polyp measuring 3 mm, tubular adenoma, recommended repeat in 10 years.   . ESOPHAGOGASTRODUODENOSCOPY (EGD) WITH ESOPHAGEAL DILATION  06/17/2012   [id number] ring was found at the gastroesophageal junction/Non-erosive gastritis (inflammation)  . ESOPHAGOGASTRODUODENOSCOPY (EGD) WITH PROPOFOL  06/04/2012   [id number] ring was found at the gastroesophageal junction/SIGMOID ESOPHAGUS(DISTAL)/Non-erosive gastritis (inflammation)  . ESOPHAGOGASTRODUODENOSCOPY (EGD) WITH PROPOFOL N/A 10/01/2018   Dr. Oneida Alar: LA Grade B esophagitis, hh, peptic stricture s/p dilation, gastritis (due to Mobic)  . NASAL SINUS SURGERY     x3  . PARTIAL HYSTERECTOMY    . POLYPECTOMY  06/04/2012   Procedure: POLYPECTOMY;  Surgeon: Danie Binder, MD;  Location: [facility];  Service: Endoscopy;  Laterality: N/A;  . ROTATOR CUFF REPAIR    . SAVORY DILATION  06/04/2012   Procedure: SAVORY DILATION;  Surgeon: Danie Binder, MD;  Location: [facility];  Service: Endoscopy;  Laterality: N/A;  3m, 12.8 mm, 14 mm  . SAVORY DILATION N/A 10/01/2018   Procedure: SAVORY DILATION;  Surgeon: FDanie Binder MD;  Location: [facility];  Service: Endoscopy;  Laterality: N/A;  . SHOULDER SURGERY     left  .  TONSILLECTOMY    . TUBAL LIGATION      Current Outpatient Medications  Medication Sig Dispense Refill  . acetaminophen (TYLENOL) 500 MG tablet Take 1,000 mg by mouth as needed for moderate pain.     .Marland KitchenALPRAZolam (XANAX) 0.5 MG tablet Take 0.5 mg by mouth 3 (three) times daily as needed for anxiety.     .Marland KitchenamLODipine (NORVASC) 10 MG tablet Take 10 mg by mouth daily.    .Marland Kitchenatenolol (TENORMIN) 50 MG tablet Take 100 mg by mouth daily.     .Marland Kitchenconjugated estrogens (PREMARIN) vaginal cream Place 1 Applicatorful vaginally as needed.    . CYMBALTA 60 MG capsule Take 60 mg by mouth 2 (two) times daily.    . diclofenac Sodium (VOLTAREN) 1 % GEL Apply topically as needed.    . famotidine (PEPCID) 20  MG tablet Take 20 mg by mouth at bedtime.    . gabapentin (NEURONTIN) 300 MG capsule Take 2 capsules by mouth at bedtime.    . hydroxychloroquine (PLAQUENIL) 200 MG tablet Take 200-400 mg by mouth 2 (two) times daily. Takes 476m bid Mon-Fri, 2052mdaily on Sat and Sun    . linaclotide (LINZESS) 145 MCG CAPS capsule Take 1 capsule (145 mcg total) by mouth daily as needed. 30 capsule 5  . montelukast (SINGULAIR) 10 MG tablet Take 10 mg by mouth as needed.     . Marland Kitchenmeprazole (PRILOSEC) 40 MG capsule Take 1 capsule (40 mg total) by mouth 2 (two) times daily before a meal. 60 capsule 3  . Oxycodone HCl 20 MG TABS Take 15 mg by mouth as needed for pain.     . Marland KitcheniZANidine (ZANAFLEX) 2 MG tablet Take 2 mg by mouth daily.    . Marland Kitchenocilizumab (ACTEMRA) 400 MG/20ML SOLN injection Actemra 400 mg/20 mL (20 mg/mL) intravenous solution  Infuse 4 mg/kg once every 4 weeksWt: 151 lbs--> 27095m  . traZODone (DESYREL) 50 MG tablet Take 50 mg by mouth at bedtime as needed for sleep.     No current facility-administered medications for this visit.   Facility-Administered Medications Ordered in Other Visits  Medication Dose Route Frequency Provider Last Rate Last Admin  . mineral oil light 100 % (sterile)    PRN FieDanie BinderD    1 application at 11/41/32/4445    Allergies as of 10/21/2020 - Review Complete 10/21/2020  Allergen Reaction Noted  . Carafate [sucralfate]  10/01/2014  . Compazine [prochlorperazine edisylate] Other (See Comments) 05/22/2012  . Erythromycin Nausea And Vomiting 09/23/2018  . Sulfa antibiotics Nausea And Vomiting 05/22/2012    Family History  Problem Relation Age of Onset  . Diabetes Father   . Kidney failure Father   . Breast cancer Mother   . Stroke Mother   . Diabetes Other        siblings  . Lymphoma Sister   . Liver disease Other        history of elevated LFTs in sister too  . Colon cancer Neg Hx     Social History   Socioeconomic History  . Marital status: Married    Spouse name: Not on file  . Number of children: 1  . Years of education: Not on file  . Highest education level: Not on file  Occupational History  . Occupation: disability    Employer: UNEMPLOYED  Tobacco Use  . Smoking status: Never Smoker  . Smokeless tobacco: Never Used  Vaping Use  . Vaping Use: Never used  Substance and Sexual Activity  . Alcohol use: No  . Drug use: No  . Sexual activity: Not on file  Other Topics Concern  . Not on file  Social History Narrative  . Not on file   Social Determinants of Health   Financial Resource Strain: Not on file  Food Insecurity: Not on file  Transportation Needs: Not on file  Physical Activity: Not on file  Stress: Not on file  Social Connections: Not on file    Review of Systems: Gen: Denies fever, chills, cold or flulike symptoms, lightheadedness, dizziness, presyncope, syncope. CV: Denies chest pain or palpitations. Resp: Denies dyspnea or cough. GI: See HPI Heme: See HPI  Physical Exam: BP 126/76   Pulse 79   Temp (!) 97.1 F (36.2 C) (Temporal)   Ht _0  (1.549 m)   Wt  152 lb 3.2 oz (69 kg)   BMI 28.76 kg/m  General:   Alert and oriented. No distress noted. Pleasant and cooperative.  Head:  Normocephalic and  atraumatic. Eyes:  Conjuctiva clear without scleral icterus. Mouth:  Oral mucosa pink and moist. Good dentition. No lesions. Heart:  S1, S2 present without murmurs appreciated. Lungs:  Clear to auscultation bilaterally. No wheezes, rales, or rhonchi. No distress.  Abdomen:  +BS, soft, non-tender and non-distended. No rebound or guarding. No HSM or masses noted. Rectal: No external abnormalities. Internal exam with no appreciable hemorrhoids or masses, Stool on gloved exam finger was brown.  Hemoccult positive. Msk:  Symmetrical without gross deformities. Normal posture. Extremities:  Without edema. Neurologic:  Alert and  oriented x4 Psych: Normal mood and affect.   Assessment: 70 year old female with history of GERD, dysphagia, and constipation presenting today with chief complaint of dark stools, constipation with change in stool caliber, postprandial lower abdominal cramping, uncontrolled GERD, and solid food dysphagia.  Dark stools: Unclear if this is true melena. However, on rectal exam today, though the stool was brown, Hemoccult was positive.  Chronic history of GERD that is not adequately controlled and also with dysphagia.  Denies epigastric pain or NSAID use, but does report being on prednisone recently due to bronchitis..  Denies BRBPR, but she does report change in stool caliber, now much smaller caliber stools or passing small pieces of stool.  Last EGD in March 2020 with LA grade B esophagitis, hiatal hernia, peptic stricture s/p dilation, and gastritis.  Differentials include esophagitis, gastritis, duodenitis, PUD, AVMs, and cannot rule out right-sided colon lesion.  We discussed EGD as well as colonoscopy in light of change in stool caliber.  Patient is okay with EGD but preferred to hold off on colonoscopy.  Will also update labs.  GERD: Not adequately managed.  Currently on Pepcid 20 mg every evening.  Previously also on Protonix 40 mg twice daily, but patient reports her  pharmacist advised that the medication has been recalled.  Additionally, she tells me when taking Protonix twice daily, she would still have breakthrough symptoms every other day.  Previously failed Nexium and Prevacid.  We will try her on omeprazole 40 mg twice daily and have her continue Pepcid 20 mg in the evening.  Also counseled extensively on following a GERD diet and lifestyle.  Dysphagia: Solid food dysphagia requiring food regurgitation.  This is in setting of uncontrolled GERD.  History of peptic stricture requiring dilation in 2020.  Suspect she may have recurrent stricture.  We will proceed with EGD for further evaluation and therapeutic intervention.  Constipation and change in stool caliber: Chronic history of constipation.  Not adequately managed.  Linzess 145 mcg seems to be too strong at this time.  With Linzess 145 mcg, she is having diarrhea, but without Linzess, she may skip several days between bowel movements.  Denies bright red blood per rectum, but she does report new onset thin caliber stool or only passing small pieces.  Last colonoscopy in November 2013 with 1 tubular adenoma, due for repeat in 2023.  We discussed updating her colonoscopy at this time due to change in stool caliber, but she declined.  We will try her on Linzess 72 mcg daily and Benefiber daily.  Lower abdominal cramping: 1 month history of postprandial lower abdominal cramping that is short-lived and improves with passing gas or having a bowel movement.  Query whether underlying constipation and possibly lactose intolerance may be contributing.  She  has recently started drinking Dannon yogurt shakes and eats other dairy daily.  Her abdominal exam is benign.  She was advised to follow lactose-free diet, try Beano before meals, simethicone as needed.  Constipation addressed above.   Plan: 1.  CBC, CMP.  2.  Proceed with EGD +/-dilation with propofol with Dr. Abbey Chatters in the near future. The risks, benefits, and  alternatives have been discussed with the patient in detail. The patient states understanding and desires to proceed.  ASA III  3.  Stop Protonix and start omeprazole 40 mg twice daily 30 minutes before breakfast and dinner.  4.  Continue Pepcid 20 mg at bedtime.  5.  Counseled on GERD diet/lifestyle.  Written instructions provided.  6.  Stop Linzess 145 mcg and start Linzess 72 mcg daily 30 minutes before first meal.  7.  Start Benefiber 2 teaspoons daily x2 weeks and increase to twice daily.  8.  Follow lactose-free diet or take Lactaid tablets prior to dairy consumption.  9.  Try Beano before meals.  10.  Use simethicone as needed for gas relief.  11.  Requested progress report in 2 weeks on constipation.  Advised to call sooner if any worsening symptoms of lower abdominal cramping.  12.  Follow-up after EGD.    Aliene Altes, PA-C [facility] 10/21/2020

## 2020-10-21 ENCOUNTER — Ambulatory Visit (INDEPENDENT_AMBULATORY_CARE_PROVIDER_SITE_OTHER): Payer: Medicare Other | Admitting: Gastroenterology

## 2020-10-21 ENCOUNTER — Encounter: Payer: Self-pay | Admitting: *Deleted

## 2020-10-21 ENCOUNTER — Other Ambulatory Visit: Payer: Self-pay

## 2020-10-21 ENCOUNTER — Encounter: Payer: Self-pay | Admitting: Gastroenterology

## 2020-10-21 VITALS — BP 126/76 | HR 79 | Temp 97.1°F | Ht 61.0 in | Wt 152.2 lb

## 2020-10-21 DIAGNOSIS — K5900 Constipation, unspecified: Secondary | ICD-10-CM

## 2020-10-21 DIAGNOSIS — R195 Other fecal abnormalities: Secondary | ICD-10-CM | POA: Diagnosis not present

## 2020-10-21 DIAGNOSIS — K219 Gastro-esophageal reflux disease without esophagitis: Secondary | ICD-10-CM | POA: Diagnosis not present

## 2020-10-21 DIAGNOSIS — R1310 Dysphagia, unspecified: Secondary | ICD-10-CM | POA: Diagnosis not present

## 2020-10-21 DIAGNOSIS — R109 Unspecified abdominal pain: Secondary | ICD-10-CM

## 2020-10-21 MED ORDER — OMEPRAZOLE 40 MG PO CPDR: 40.0000 mg | DELAYED_RELEASE_CAPSULE | Freq: Two times a day (BID) | ORAL | 3 refills | Status: DC

## 2020-10-21 NOTE — Patient Instructions (Addendum)
Please have labs completed at [facility].  We will arrange for you to have an upper endoscopy with possible dilation of your esophagus near future with Dr. Abbey Chatters.    Stop Protonix and start omeprazole 40 mg twice daily 30 minutes before breakfast and dinner.  Continue Pepcid 20 mg at bedtime.  Follow a GERD diet:  Avoid fried, fatty, greasy, spicy, citrus foods. Avoid caffeine and carbonated beverages. Avoid chocolate. Try eating 4-6 small meals a day rather than 3 large meals. Do not eat within 3 hours of laying down. Prop head of bed up on wood or bricks to create a 6 inch incline.  For constipation:  Stop Linzess 145 mcg. Start Linzess 72 mcg daily 30 minutes before first meal.  Start benefiber 2 teaspoons daily x2 weeks then increase to twice daily.  Call in a progress report in 2 weeks.  To help with cramping/gas: Follow a lactose free diet or take lactaid tablets prior to dairy consumption.  You can try taking beano before meals.  Use simethicone as needed for gas relief.  Hopefully getting your constipation under better control will help with this as well. Call with any worsening symptoms.   We will see you back in the office after your procedure.     Aliene Altes, PA-C [facility]

## 2020-10-22 LAB — COMPLETE METABOLIC PANEL WITH GFR
AG Ratio: 1.2 (calc) (ref 1.0–2.5)
ALT: 30 U/L — ABNORMAL HIGH (ref 6–29)
AST: 39 U/L — ABNORMAL HIGH (ref 10–35)
Albumin: 3.8 g/dL (ref 3.6–5.1)
Alkaline phosphatase (APISO): 115 U/L (ref 37–153)
BUN/Creatinine Ratio: 12 (calc) (ref 6–22)
BUN: 14 mg/dL (ref 7–25)
CO2: 31 mmol/L (ref 20–32)
Calcium: 9.4 mg/dL (ref 8.6–10.4)
Chloride: 101 mmol/L (ref 98–110)
Creat: 1.18 mg/dL — ABNORMAL HIGH (ref 0.60–0.93)
GFR, Est African American: 54 mL/min/{1.73_m2} — ABNORMAL LOW (ref >=60)
GFR, Est Non African American: 46 mL/min/{1.73_m2} — ABNORMAL LOW (ref >=60)
Globulin: 3.2 g/dL (calc) (ref 1.9–3.7)
Glucose, Bld: 103 mg/dL (ref 65–139)
Potassium: 3.1 mmol/L — ABNORMAL LOW (ref 3.5–5.3)
Sodium: 140 mmol/L (ref 135–146)
Total Bilirubin: 0.3 mg/dL (ref 0.2–1.2)
Total Protein: 7 g/dL (ref 6.1–8.1)

## 2020-10-22 LAB — CBC WITH DIFFERENTIAL/PLATELET
Absolute Monocytes: 1426 cells/uL — ABNORMAL HIGH (ref 200–950)
Basophils Absolute: 88 cells/uL (ref 0–200)
Basophils Relative: 1 %
Eosinophils Absolute: 590 cells/uL — ABNORMAL HIGH (ref 15–500)
Eosinophils Relative: 6.7 %
HCT: 37.6 % (ref 35.0–45.0)
Hemoglobin: 11.8 g/dL (ref 11.7–15.5)
Lymphs Abs: 1558 cells/uL (ref 850–3900)
MCH: 28 pg (ref 27.0–33.0)
MCHC: 31.4 g/dL — ABNORMAL LOW (ref 32.0–36.0)
MCV: 89.3 fL (ref 80.0–100.0)
MPV: 11 fL (ref 7.5–12.5)
Monocytes Relative: 16.2 %
Neutro Abs: 5139 cells/uL (ref 1500–7800)
Neutrophils Relative %: 58.4 %
Platelets: 362 10*3/uL (ref 140–400)
RBC: 4.21 10*6/uL (ref 3.80–5.10)
RDW: 13.5 % (ref 11.0–15.0)
Total Lymphocyte: 17.7 %
WBC: 8.8 10*3/uL (ref 3.8–10.8)

## 2020-10-26 ENCOUNTER — Telehealth: Payer: Self-pay | Admitting: Internal Medicine

## 2020-10-26 NOTE — Telephone Encounter (Signed)
Routing message to Dr. Reola Mosher nurse. See lab results.

## 2020-10-26 NOTE — Telephone Encounter (Signed)
Patient returned call from this office, please call back

## 2020-10-27 NOTE — Telephone Encounter (Signed)
See lab result

## 2020-11-01 ENCOUNTER — Other Ambulatory Visit: Payer: Self-pay | Admitting: Gastroenterology

## 2020-11-01 DIAGNOSIS — E876 Hypokalemia: Secondary | ICD-10-CM

## 2020-11-01 MED ORDER — POTASSIUM CHLORIDE ER 10 MEQ PO TBCR: 40.0000 meq | EXTENDED_RELEASE_TABLET | Freq: Every day | ORAL | 0 refills | Status: DC

## 2020-11-04 ENCOUNTER — Other Ambulatory Visit: Payer: Self-pay

## 2020-11-04 DIAGNOSIS — R7989 Other specified abnormal findings of blood chemistry: Secondary | ICD-10-CM

## 2020-12-01 NOTE — Patient Instructions (Signed)
Sharon Spencer  12/01/2020     @PREFPERIOPPHARMACY @   Your procedure is scheduled on  12/07/2020.   Report to [facility] at  0830  A.M.   Call this number if you have problems the morning of surgery:  [phone]   Remember:  Follow the diet instructions given to you by the office.                        Take these medicines the morning of surgery with A SIP OF WATER  Amlodipine, atenolol, cymbalta, arava, prilosec, oxycodone (if needed).  If your glucose is 70 or below the morning of your procedure, drink 1/2 cup of clear liquid containing sugar and recheck your glucose in 15 minutes. If your glucose is still 70 or below, call [phone] for instructions.  If your glucose is 300 or above the morning of your procedure, call [phone] for instructions.     Please brush your teeth.  Do not wear jewelry, make-up or nail polish.  Do not wear lotions, powders, or perfumes, or deodorant.  Do not shave 48 hours prior to surgery.  Men may shave face and neck.  Do not bring valuables to the hospital.  [facility] is not responsible for any belongings or valuables.  Contacts, dentures or bridgework may not be worn into surgery.  Leave your suitcase in the car.  After surgery it may be brought to your room.  For patients admitted to the hospital, discharge time will be determined by your treatment team.  Patients discharged the day of surgery will not be allowed to drive home and must have someone with them for 24 hours.     Special instructions:  DO NOT smoke tobacco or vape for 24 hours before your procedure.  Please read over the following fact sheets that you were given. Anesthesia Post-op Instructions and Care and Recovery After Surgery       Upper Endoscopy, Adult, Care After This sheet gives you information about how to care for yourself after your procedure. Your health care provider may also give you more specific instructions. If you have problems  or questions, contact your health care provider. What can I expect after the procedure? After the procedure, it is common to have:  A sore throat.  Mild stomach pain or discomfort.  Bloating.  Nausea. Follow these instructions at home:  Follow instructions from your health care provider about what to eat or drink after your procedure.  Return to your normal activities as told by your health care provider. Ask your health care provider what activities are safe for you.  Take over-the-counter and prescription medicines only as told by your health care provider.  If you were given a sedative during the procedure, it can affect you for several hours. Do not drive or operate machinery until your health care provider says that it is safe.  Keep all follow-up visits as told by your health care provider. This is important.   Contact a health care provider if you have:  A sore throat that lasts longer than one day.  Trouble swallowing. Get help right away if:  You vomit blood or your vomit looks like coffee grounds.  You have: ? A fever. ? Bloody, black, or tarry stools. ? A severe sore throat or you cannot swallow. ? Difficulty breathing. ? Severe pain in your chest or abdomen. Summary  After the procedure, it is  common to have a sore throat, mild stomach discomfort, bloating, and nausea.  If you were given a sedative during the procedure, it can affect you for several hours. Do not drive or operate machinery until your health care provider says that it is safe.  Follow instructions from your health care provider about what to eat or drink after your procedure.  Return to your normal activities as told by your health care provider. This information is not intended to replace advice given to you by your health care provider. Make sure you discuss any questions you have with your health care provider. Document Revised: 07/01/2019 Document Reviewed: 12/03/2017 Elsevier Patient  Education  2021 [employer].  https://www.asge.org/home/for-patients/patient-information/understanding-eso-dilation-updated">  Esophageal Dilatation Esophageal dilatation, also called esophageal dilation, is a procedure to widen or open a blocked or narrowed part of the esophagus. The esophagus is the part of the body that moves food and liquid from the mouth to the stomach. You may need this procedure if:  You have a buildup of scar tissue in your esophagus that makes it difficult, painful, or impossible to swallow. This can be caused by gastroesophageal reflux disease (GERD).  You have cancer of the esophagus.  There is a problem with how food moves through your esophagus. In some cases, you may need this procedure repeated at a later time to dilate the esophagus gradually. Tell a health care provider about:  Any allergies you have.  All medicines you are taking, including vitamins, herbs, eye drops, creams, and over-the-counter medicines.  Any problems you or family members have had with anesthetic medicines.  Any blood disorders you have.  Any surgeries you have had.  Any medical conditions you have.  Any antibiotic medicines you are required to take before dental procedures.  Whether you are pregnant or may be pregnant. What are the risks? Generally, this is a safe procedure. However, problems may occur, including:  Bleeding due to a tear in the lining of the esophagus.  A hole, or perforation, in the esophagus. What happens before the procedure?  Ask your health care provider about: ? Changing or stopping your regular medicines. This is especially important if you are taking diabetes medicines or blood thinners. ? Taking medicines such as aspirin and ibuprofen. These medicines can thin your blood. Do not take these medicines unless your health care provider tells you to take them. ? Taking over-the-counter medicines, vitamins, herbs, and supplements.  Follow  instructions from your health care provider about eating or drinking restrictions.  Plan to have a responsible adult take you home from the hospital or clinic.  Plan to have a responsible adult care for you for the time you are told after you leave the hospital or clinic. This is important. What happens during the procedure?  You may be given a medicine to help you relax (sedative).  A numbing medicine may be sprayed into the back of your throat, or you may gargle the medicine.  Your health care provider may perform the dilatation using various surgical instruments, such as: ? Simple dilators. This instrument is carefully placed in the esophagus to stretch it. ? Guided wire bougies. This involves using an endoscope to insert a wire into the esophagus. A dilator is passed over this wire to enlarge the esophagus. Then the wire is removed. ? Balloon dilators. An endoscope with a small balloon is inserted into the esophagus. The balloon is inflated to stretch the esophagus and open it up. The procedure may vary among health  care providers and hospitals. What can I expect after the procedure?  Your blood pressure, heart rate, breathing rate, and blood oxygen level will be monitored until you leave the hospital or clinic.  Your throat may feel slightly sore and numb. This will get better over time.  You will not be allowed to eat or drink until your throat is no longer numb.  When you are able to drink, urinate, and sit on the edge of the bed without nausea or dizziness, you may be able to return home. Follow these instructions at home:  Take over-the-counter and prescription medicines only as told by your health care provider.  If you were given a sedative during the procedure, it can affect you for several hours. Do not drive or operate machinery until your health care provider says that it is safe.  Plan to have a responsible adult care for you for the time you are told. This is  important.  Follow instructions from your health care provider about any eating or drinking restrictions.  Do not use any products that contain nicotine or tobacco, such as cigarettes, e-cigarettes, and chewing tobacco. If you need help quitting, ask your health care provider.  Keep all follow-up visits. This is important. Contact a health care provider if:  You have a fever.  You have pain that is not relieved by medicine. Get help right away if:  You have chest pain.  You have trouble breathing.  You have trouble swallowing.  You vomit blood.  You have black, tarry, or bloody stools. These symptoms may represent a serious problem that is an emergency. Do not wait to see if the symptoms will go away. Get medical help right away. Call your local emergency services (911 in the [place] Do not drive yourself to the hospital. Summary  Esophageal dilatation, also called esophageal dilation, is a procedure to widen or open a blocked or narrowed part of the esophagus.  Plan to have a responsible adult take you home from the hospital or clinic.  For this procedure, a numbing medicine may be sprayed into the back of your throat, or you may gargle the medicine.  Do not drive or operate machinery until your health care provider says that it is safe. This information is not intended to replace advice given to you by your health care provider. Make sure you discuss any questions you have with your health care provider. Document Revised: 11/19/2019 Document Reviewed: 11/19/2019 Elsevier Patient Education  2021 [place] After This sheet gives you information about how to care for yourself after your procedure. Your health care provider may also give you more specific instructions. If you have problems or questions, contact your health care provider. What can I expect after the procedure? After the procedure, it is common to  have:  Tiredness.  Forgetfulness about what happened after the procedure.  Impaired judgment for important decisions.  Nausea or vomiting.  Some difficulty with balance. Follow these instructions at home: For the time period you were told by your health care provider:  Rest as needed.  Do not participate in activities where you could fall or become injured.  Do not drive or use machinery.  Do not drink alcohol.  Do not take sleeping pills or medicines that cause drowsiness.  Do not make important decisions or sign legal documents.  Do not take care of children on your own.      Eating and drinking  Follow the diet  that is recommended by your health care provider.  Drink enough fluid to keep your urine pale yellow.  If you vomit: ? Drink water, juice, or soup when you can drink without vomiting. ? Make sure you have little or no nausea before eating solid foods. General instructions  Have a responsible adult stay with you for the time you are told. It is important to have someone help care for you until you are awake and alert.  Take over-the-counter and prescription medicines only as told by your health care provider.  If you have sleep apnea, surgery and certain medicines can increase your risk for breathing problems. Follow instructions from your health care provider about wearing your sleep device: ? Anytime you are sleeping, including during daytime naps. ? While taking prescription pain medicines, sleeping medicines, or medicines that make you drowsy.  Avoid smoking.  Keep all follow-up visits as told by your health care provider. This is important. Contact a health care provider if:  You keep feeling nauseous or you keep vomiting.  You feel light-headed.  You are still sleepy or having trouble with balance after 24 hours.  You develop a rash.  You have a fever.  You have redness or swelling around the IV site. Get help right away if:  You have  trouble breathing.  You have new-onset confusion at home. Summary  For several hours after your procedure, you may feel tired. You may also be forgetful and have poor judgment.  Have a responsible adult stay with you for the time you are told. It is important to have someone help care for you until you are awake and alert.  Rest as told. Do not drive or operate machinery. Do not drink alcohol or take sleeping pills.  Get help right away if you have trouble breathing, or if you suddenly become confused. This information is not intended to replace advice given to you by your health care provider. Make sure you discuss any questions you have with your health care provider. Document Revised: 03/18/2020 Document Reviewed: 06/05/2019 Elsevier Patient Education  2021 [employer].

## 2020-12-03 ENCOUNTER — Other Ambulatory Visit (HOSPITAL_COMMUNITY): Payer: Self-pay | Admitting: Family Medicine

## 2020-12-03 ENCOUNTER — Other Ambulatory Visit (HOSPITAL_COMMUNITY)
Admission: RE | Admit: 2020-12-03 | Discharge: 2020-12-03 | Disposition: A | Discharge status: Home / Self Care | Payer: Medicare Other | Source: Ambulatory Visit | Attending: Internal Medicine | Admitting: Internal Medicine

## 2020-12-03 ENCOUNTER — Encounter (HOSPITAL_COMMUNITY): Payer: Self-pay

## 2020-12-03 ENCOUNTER — Encounter (HOSPITAL_COMMUNITY)
Admission: RE | Admit: 2020-12-03 | Discharge: 2020-12-03 | Disposition: A | Discharge status: Home / Self Care | Payer: Medicare Other | Source: Ambulatory Visit | Attending: Internal Medicine | Admitting: Internal Medicine

## 2020-12-03 ENCOUNTER — Other Ambulatory Visit: Payer: Self-pay

## 2020-12-03 DIAGNOSIS — Z01818 Encounter for other preprocedural examination: Secondary | ICD-10-CM | POA: Diagnosis present

## 2020-12-03 DIAGNOSIS — Z20822 Contact with and (suspected) exposure to covid-19: Secondary | ICD-10-CM | POA: Diagnosis not present

## 2020-12-03 DIAGNOSIS — Z1231 Encounter for screening mammogram for malignant neoplasm of breast: Secondary | ICD-10-CM

## 2020-12-03 LAB — SARS CORONAVIRUS 2 (TAT 6-24 HRS): SARS Coronavirus 2: NEGATIVE

## 2020-12-07 ENCOUNTER — Ambulatory Visit (HOSPITAL_COMMUNITY)
Admission: RE | Admit: 2020-12-07 | Discharge: 2020-12-07 | Disposition: A | Discharge status: Home / Self Care | Payer: Medicare Other | Source: Ambulatory Visit | Attending: Internal Medicine | Admitting: Internal Medicine

## 2020-12-07 ENCOUNTER — Ambulatory Visit (HOSPITAL_COMMUNITY): Payer: Medicare Other | Admitting: Anesthesiology

## 2020-12-07 ENCOUNTER — Encounter (HOSPITAL_COMMUNITY)
Admission: RE | Disposition: A | Discharge status: Home / Self Care | Payer: Self-pay | Source: Ambulatory Visit | Attending: Internal Medicine

## 2020-12-07 ENCOUNTER — Encounter (HOSPITAL_COMMUNITY): Payer: Self-pay

## 2020-12-07 ENCOUNTER — Other Ambulatory Visit: Payer: Self-pay

## 2020-12-07 DIAGNOSIS — K2970 Gastritis, unspecified, without bleeding: Secondary | ICD-10-CM | POA: Diagnosis not present

## 2020-12-07 DIAGNOSIS — Z79899 Other long term (current) drug therapy: Secondary | ICD-10-CM | POA: Insufficient documentation

## 2020-12-07 DIAGNOSIS — R1310 Dysphagia, unspecified: Secondary | ICD-10-CM | POA: Diagnosis present

## 2020-12-07 DIAGNOSIS — K222 Esophageal obstruction: Secondary | ICD-10-CM | POA: Diagnosis not present

## 2020-12-07 DIAGNOSIS — K219 Gastro-esophageal reflux disease without esophagitis: Secondary | ICD-10-CM | POA: Insufficient documentation

## 2020-12-07 HISTORY — PX: BALLOON DILATION: SHX5330

## 2020-12-07 HISTORY — PX: ESOPHAGOGASTRODUODENOSCOPY (EGD) WITH PROPOFOL: SHX5813

## 2020-12-07 HISTORY — PX: BIOPSY: SHX5522

## 2020-12-07 SURGERY — ESOPHAGOGASTRODUODENOSCOPY (EGD) WITH PROPOFOL
Anesthesia: General

## 2020-12-07 MED ORDER — LACTATED RINGERS IV SOLN: INTRAVENOUS | Status: DC

## 2020-12-07 MED ORDER — PROPOFOL 500 MG/50ML IV EMUL
INTRAVENOUS | Status: DC | PRN
  Administered 2020-12-07: 150 ug/kg/min via INTRAVENOUS

## 2020-12-07 MED ORDER — KETAMINE HCL 50 MG/5ML IJ SOSY
PREFILLED_SYRINGE | INTRAMUSCULAR | Status: AC
  Filled 2020-12-07: qty 5

## 2020-12-07 MED ORDER — PROPOFOL 10 MG/ML IV BOLUS
INTRAVENOUS | Status: DC | PRN
  Administered 2020-12-07: 50 mg via INTRAVENOUS

## 2020-12-07 MED ORDER — KETAMINE HCL 10 MG/ML IJ SOLN
INTRAMUSCULAR | Status: DC | PRN
  Administered 2020-12-07: 20 mg via INTRAVENOUS

## 2020-12-07 NOTE — Anesthesia Preprocedure Evaluation (Signed)
Anesthesia Evaluation  Patient identified by MRN, date of birth, ID band Patient awake    Reviewed: Allergy & Precautions, NPO status , Patient's Chart, lab work & pertinent test results, reviewed documented beta blocker date and time   History of Anesthesia Complications (+) PONV and history of anesthetic complications  Airway Mallampati: I  TM Distance: >3 FB Neck ROM: Full    Dental  (+) Dental Advisory Given, Chipped,    Pulmonary shortness of breath and with exertion,    Pulmonary exam normal breath sounds clear to auscultation       Cardiovascular Exercise Tolerance: Good hypertension, Pt. on medications and Pt. on home beta blockers Normal cardiovascular exam Rhythm:Regular Rate:Normal     Neuro/Psych  Headaches, PSYCHIATRIC DISORDERS Anxiety Depression TIA Neuromuscular disease    GI/Hepatic GERD  Medicated,  Endo/Other  diabetes, Well Controlled, Type 2, Oral Hypoglycemic Agents  Renal/GU Renal disease     Musculoskeletal  (+) Arthritis , Rheumatoid disorders,  Fibromyalgia -  Abdominal   Peds  Hematology   Anesthesia Other Findings   Reproductive/Obstetrics                            Anesthesia Physical Anesthesia Plan  ASA: III  Anesthesia Plan: General   Post-op Pain Management:    Induction: Intravenous  PONV Risk Score and Plan: Propofol infusion  Airway Management Planned: Nasal Cannula and Natural Airway  Additional Equipment:   Intra-op Plan:   Post-operative Plan:   Informed Consent: I have reviewed the patients History and Physical, chart, labs and discussed the procedure including the risks, benefits and alternatives for the proposed anesthesia with the patient or authorized representative who has indicated his/her understanding and acceptance.     Dental advisory given  Plan Discussed with: CRNA and Surgeon  Anesthesia Plan Comments:        Anesthesia Quick Evaluation

## 2020-12-07 NOTE — Anesthesia Postprocedure Evaluation (Signed)
Anesthesia Post Note  Patient: Sharon Spencer  Procedure(s) Performed: ESOPHAGOGASTRODUODENOSCOPY (EGD) WITH PROPOFOL (N/A ) BALLOON DILATION (N/A ) BIOPSY  Patient location during evaluation: Short Stay Anesthesia Type: General Level of consciousness: awake and alert and oriented Pain management: pain level controlled Vital Signs Assessment: post-procedure vital signs reviewed and stable Respiratory status: spontaneous breathing Cardiovascular status: blood pressure returned to baseline and stable Postop Assessment: no apparent nausea or vomiting Anesthetic complications: no   No complications documented.   Last Vitals:  Vitals:   12/07/20 0910  BP: (!) 172/83  Pulse: 76  Resp: 14  Temp: 36.7 C  SpO2: 93%    Last Pain:  Vitals:   12/07/20 1032  TempSrc:   PainSc: 7                  Tannya Gonet

## 2020-12-07 NOTE — OR Nursing (Signed)
Patients IV site in forearm appeared Swollen, no red streaks , no heat noted, pt stated the area was " tender to touch". Ice applied and a pressure bandage applied

## 2020-12-07 NOTE — Anesthesia Postprocedure Evaluation (Signed)
Anesthesia Post Note  Patient: Sharon Spencer  Procedure(s) Performed: ESOPHAGOGASTRODUODENOSCOPY (EGD) WITH PROPOFOL (N/A ) BALLOON DILATION (N/A ) BIOPSY  Patient location during evaluation: Short Stay Anesthesia Type: General Level of consciousness: awake and alert Pain management: pain level controlled Vital Signs Assessment: post-procedure vital signs reviewed and stable Respiratory status: spontaneous breathing Cardiovascular status: blood pressure returned to baseline and stable Postop Assessment: no apparent nausea or vomiting Anesthetic complications: no   No complications documented.   Last Vitals:  Vitals:   12/07/20 0910  BP: (!) 172/83  Pulse: 76  Resp: 14  Temp: 36.7 C  SpO2: 93%    Last Pain:  Vitals:   12/07/20 1032  TempSrc:   PainSc: 7                  Ailish Prospero

## 2020-12-07 NOTE — Transfer of Care (Signed)
Immediate Anesthesia Transfer of Care Note  Patient: Sharon Spencer  Procedure(s) Performed: ESOPHAGOGASTRODUODENOSCOPY (EGD) WITH PROPOFOL (N/A ) BALLOON DILATION (N/A ) BIOPSY  Patient Location: Short Stay  Anesthesia Type:General  Level of Consciousness: awake  Airway & Oxygen Therapy: Patient Spontanous Breathing  Post-op Assessment: Report given to RN  Post vital signs: Reviewed and stable  Last Vitals:  Vitals Value Taken Time  BP    Temp    Pulse    Resp    SpO2      Last Pain:  Vitals:   12/07/20 1032  TempSrc:   PainSc: 7          Complications: No complications documented.

## 2020-12-07 NOTE — Discharge Instructions (Signed)
EGD Discharge instructions Please read the instructions outlined below and refer to this sheet in the next few weeks. These discharge instructions provide you with general information on caring for yourself after you leave the hospital. Your doctor may also give you specific instructions. While your treatment has been planned according to the most current medical practices available, unavoidable complications occasionally occur. If you have any problems or questions after discharge, please call your doctor. ACTIVITY  You may resume your regular activity but move at a slower pace for the next 24 hours.   Take frequent rest periods for the next 24 hours.   Walking will help expel (get rid of) the air and reduce the bloated feeling in your abdomen.   No driving for 24 hours (because of the anesthesia (medicine) used during the test).   You may shower.   Do not sign any important legal documents or operate any machinery for 24 hours (because of the anesthesia used during the test).  NUTRITION  Drink plenty of fluids.   You may resume your normal diet.   Begin with a light meal and progress to your normal diet.   Avoid alcoholic beverages for 24 hours or as instructed by your caregiver.  MEDICATIONS  You may resume your normal medications unless your caregiver tells you otherwise.  WHAT YOU CAN EXPECT TODAY  You may experience abdominal discomfort such as a feeling of fullness or "gas" pains.  FOLLOW-UP  Your doctor will discuss the results of your test with you.  SEEK IMMEDIATE MEDICAL ATTENTION IF ANY OF THE FOLLOWING OCCUR:  Excessive nausea (feeling sick to your stomach) and/or vomiting.   Severe abdominal pain and distention (swelling).   Trouble swallowing.   Temperature over 101 F (37.8 C).   Rectal bleeding or vomiting of blood.    Your EGD revealed a mild amount inflammation in your stomach.  I took biopsies of this to rule infection a bacteria called H. pylori.   Await pathology results, my office will contact you.  You also had a slight narrowing of your esophagus so I dilated this with the balloon.  Hopefully this helps with your swallowing.  Continue omeprazole twice daily.  Follow-up with GI in 3 to 4 months.  I hope you have a great rest of your week!  Elon Alas. Abbey Chatters, D.O. Gastroenterology and Hepatology [facility]

## 2020-12-07 NOTE — H&P (Signed)
Primary Care Physician:  Franki Monte, DO Primary Gastroenterologist:  Dr. Abbey Chatters  Pre-Procedure History & Physical: HPI:  Sharon Spencer is a 70 y.o. female is here for an EGD with possible dilation for GERD and dysphagia.   Past Medical History:  Diagnosis Date  . Anxiety   . Arthritis    rheumatoid  . Carpal tunnel syndrome   . Depression   . Diabetes mellitus ([place])   . Elevated liver enzymes 02/27/2013   AUG 2014 GGT 470 ALT 41 AST 32 ALK PHOS 275   . Fibromyalgia   . HTN (hypertension)   . Lumbago   . Migraine   . Myalgia and myositis, unspecified   . Osteoporosis   . PONV (postoperative nausea and vomiting)   . Recurrent UTI    specialist at [facility]  . Rheumatoid arthritis(714.0)    sepcialist at [facility], Dr. Lynwood Dawley  . Sinusitis   . TIA (transient ischemic attack) 1991, 2006    Past Surgical History:  Procedure Laterality Date  . BACK SURGERY     lower  . COLONOSCOPY  2003   Dr. Rehman--> ischemic colitis  . COLONOSCOPY WITH PROPOFOL  06/04/2012   SLF:A sessile polyp measuring 3 mm, tubular adenoma, recommended repeat in 10 years.   . ESOPHAGOGASTRODUODENOSCOPY (EGD) WITH ESOPHAGEAL DILATION  06/17/2012   PNT:[id number] ring was found at the gastroesophageal junction/Non-erosive gastritis (inflammation)  . ESOPHAGOGASTRODUODENOSCOPY (EGD) WITH PROPOFOL  06/04/2012   MGQ:[id number] ring was found at the gastroesophageal junction/SIGMOID ESOPHAGUS(DISTAL)/Non-erosive gastritis (inflammation)  . ESOPHAGOGASTRODUODENOSCOPY (EGD) WITH PROPOFOL N/A 10/01/2018   Dr. Oneida Alar: LA Grade B esophagitis, hh, peptic stricture s/p dilation, gastritis (due to Mobic)  . NASAL SINUS SURGERY     x3  . PARTIAL HYSTERECTOMY    . POLYPECTOMY  06/04/2012   Procedure: POLYPECTOMY;  Surgeon: Danie Binder, MD;  Location: [facility];  Service: Endoscopy;  Laterality: N/A;  . ROTATOR CUFF REPAIR    . SAVORY DILATION  06/04/2012   Procedure: SAVORY DILATION;  Surgeon: Danie Binder,  MD;  Location: [facility];  Service: Endoscopy;  Laterality: N/A;  60m, 12.8 mm, 14 mm  . SAVORY DILATION N/A 10/01/2018   Procedure: SAVORY DILATION;  Surgeon: FDanie Binder MD;  Location: [facility];  Service: Endoscopy;  Laterality: N/A;  . SHOULDER SURGERY     left  . TONSILLECTOMY    . TUBAL LIGATION      Prior to Admission medications   Medication Sig Start Date End Date Taking? Authorizing Provider  acetaminophen (TYLENOL) 500 MG tablet Take 1,000 mg by mouth every 8 (eight) hours as needed for moderate pain.   Yes [provider]  ALPRAZolam (Duanne Moron 0.5 MG tablet Take 0.5 mg by mouth at bedtime as needed for anxiety. 05/01/12  Yes [provider]  amLODipine (NORVASC) 10 MG tablet Take 10 mg by mouth daily. 05/07/12  Yes [provider]  atenolol (TENORMIN) 100 MG tablet Take 100 mg by mouth daily.    Yes [provider]  CYMBALTA 60 MG capsule Take 60 mg by mouth 2 (two) times daily. 03/08/12  Yes [provider]  diclofenac Sodium (VOLTAREN) 1 % GEL Apply 1 application topically 3 (three) times daily as needed (pain).   Yes [provider]  gabapentin (NEURONTIN) 400 MG capsule Take 2 capsules (800 mg total) by mouth at bedtime. 09/29/20  Yes [provider]  hydroxychloroquine (PLAQUENIL) 200 MG tablet Take 200 mg by mouth 2 (two) times  daily. 05/07/12  Yes [provider]  leflunomide (ARAVA) 20 MG tablet Take 20 mg by mouth daily. 11/12/20  Yes [provider]  linaclotide (LINZESS) 145 MCG CAPS capsule Take 1 capsule (145 mcg total) by mouth daily as needed. 09/13/20  Yes Erenest Rasher, PA-C  Multiple Vitamins-Minerals (MULTIVITAMIN WITH MINERALS) tablet Take 1 tablet by mouth daily.   Yes [provider]  omeprazole (PRILOSEC) 40 MG capsule Take 1 capsule (40 mg total) by mouth 2 (two) times daily before a meal. 10/21/20  Yes Jodi Mourning, Kristen S, PA-C  oxyCODONE (ROXICODONE) 15 MG immediate  release tablet Take 15 mg by mouth 3 (three) times daily as needed for pain.   Yes [provider]  tiZANidine (ZANAFLEX) 2 MG tablet Take 2 mg by mouth at bedtime. 05/07/12  Yes [provider]  tocilizumab (ACTEMRA) 400 MG/20ML SOLN injection Inject into the vein every 30 (thirty) days. Every 4 weeks   Yes [provider]  traZODone (DESYREL) 50 MG tablet Take 50-100 mg by mouth at bedtime. 03/26/15  Yes [provider]    Allergies as of 10/21/2020 - Review Complete 10/21/2020  Allergen Reaction Noted  . Carafate [sucralfate]  10/01/2014  . Compazine [prochlorperazine edisylate] Other (See Comments) 05/22/2012  . Erythromycin Nausea And Vomiting 09/23/2018  . Sulfa antibiotics Nausea And Vomiting 05/22/2012    Family History  Problem Relation Age of Onset  . Diabetes Father   . Kidney failure Father   . Breast cancer Mother   . Stroke Mother   . Diabetes Other        siblings  . Lymphoma Sister   . Liver disease Other        history of elevated LFTs in sister too  . Colon cancer Neg Hx     Social History   Socioeconomic History  . Marital status: Married    Spouse name: Not on file  . Number of children: 1  . Years of education: Not on file  . Highest education level: Not on file  Occupational History  . Occupation: disability    Employer: UNEMPLOYED  Tobacco Use  . Smoking status: Never Smoker  . Smokeless tobacco: Never Used  Vaping Use  . Vaping Use: Never used  Substance and Sexual Activity  . Alcohol use: No  . Drug use: No  . Sexual activity: Not on file  Other Topics Concern  . Not on file  Social History Narrative  . Not on file   Social Determinants of Health   Financial Resource Strain: Not on file  Food Insecurity: Not on file  Transportation Needs: Not on file  Physical Activity: Not on file  Stress: Not on file  Social Connections: Not on file  Intimate Partner Violence: Not on file    Review of  Systems: See HPI, otherwise negative ROS  Physical Exam: Vital signs in last 24 hours: Temp:  [98.1 F (36.7 C)] 98.1 F (36.7 C) (05/24 0910) Pulse Rate:  [76] 76 (05/24 0910) Resp:  [14] 14 (05/24 0910) BP: (172)/(83) 172/83 (05/24 0910) SpO2:  [93 %] 93 % (05/24 0910)   General:   Alert,  Well-developed, well-nourished, pleasant and cooperative in NAD Head:  Normocephalic and atraumatic. Eyes:  Sclera clear, no icterus.   Conjunctiva pink. Ears:  Normal auditory acuity. Nose:  No deformity, discharge,  or lesions. Mouth:  No deformity or lesions, dentition normal. Neck:  Supple; no masses or thyromegaly. Lungs:  Clear throughout to auscultation.  No wheezes, crackles, or rhonchi. No acute distress. Heart:  Regular rate and rhythm; no murmurs, clicks, rubs,  or gallops. Abdomen:  Soft, nontender and nondistended. No masses, hepatosplenomegaly or hernias noted. Normal bowel sounds, without guarding, and without rebound.   Msk:  Symmetrical without gross deformities. Normal posture. Extremities:  Without clubbing or edema. Neurologic:  Alert and  oriented x4;  grossly normal neurologically. Skin:  Intact without significant lesions or rashes. Cervical Nodes:  No significant cervical adenopathy. Psych:  Alert and cooperative. Normal mood and affect.  Impression/Plan: Sharon Spencer is here for an EGD with possible dilation for GERD and dysphagia.   The risks of the procedure including infection, bleed, or perforation as well as benefits, limitations, alternatives and imponderables have been reviewed with the patient. Questions have been answered. All parties agreeable.

## 2020-12-07 NOTE — Op Note (Signed)
[facility] Patient Name: Sharon Spencer Procedure Date: 12/07/2020 10:14 AM MRN: [id number] Date of Birth: [birth year] Attending MD: Elon Alas. Abbey Chatters DO CSN: [id number] Age: 70 Admit Type: Outpatient Procedure:                Upper GI endoscopy Indications:              Dysphagia Providers:                Elon Alas. Abbey Chatters, DO, [place] Page, [place]                            Risa Grill, Technician Referring MD:              Medicines:                See the Anesthesia note for documentation of the                            administered medications Complications:            No immediate complications. Estimated Blood Loss:     Estimated blood loss was minimal. Procedure:                Pre-Anesthesia Assessment:                           - The anesthesia plan was to use monitored                            anesthesia care (MAC).                           After obtaining informed consent, the endoscope was                            passed under direct vision. Throughout the                            procedure, the patient's blood pressure, pulse, and                            oxygen saturations were monitored continuously. The                            GIF-H190 ([id number]) scope was introduced through the                            mouth, and advanced to the second part of duodenum.                            The upper GI endoscopy was accomplished without                            difficulty. The patient tolerated the procedure                            well. Scope In: 10:37:43 AM Scope  Out: 10:44:42 AM Total Procedure Duration: 0 hours 6 minutes 59 seconds  Findings:      A mild Schatzki ring was found in the distal esophagus. A TTS dilator       was passed through the scope. Dilation with an 18-19-20 mm balloon       dilator was performed to 20 mm. The dilation site was examined and       showed moderate improvement in luminal narrowing. Ring was then        obliterated with biopsy forceps. .      Localized mild inflammation characterized by erythema was found in the       gastric antrum. Biopsies were taken with a cold forceps for Helicobacter       pylori testing.      The duodenal bulb, first portion of the duodenum and second portion of       the duodenum were normal. Impression:               - Mild Schatzki ring. Dilated. Biopsied.                           - Gastritis. Biopsied.                           - Normal duodenal bulb, first portion of the                            duodenum and second portion of the duodenum. Moderate Sedation:      Per Anesthesia Care Recommendation:           - Patient has a contact number available for                            emergencies. The signs and symptoms of potential                            delayed complications were discussed with the                            patient. Return to normal activities tomorrow.                            Written discharge instructions were provided to the                            patient.                           - Resume previous diet.                           - Continue present medications.                           - Await pathology results.                           - Repeat upper endoscopy PRN for retreatment.                           -  Return to GI clinic in 4 months.                           - Use a proton pump inhibitor PO BID. Procedure Code(s):        --- Professional ---                           [phone], Esophagogastroduodenoscopy, flexible,                            transoral; with transendoscopic balloon dilation of                            esophagus (less than 30 mm diameter)                           [id number], 59, Esophagogastroduodenoscopy, flexible,                            transoral; with biopsy, single or multiple Diagnosis Code(s):        --- Professional ---                           K22.2, Esophageal obstruction                            K29.70, Gastritis, unspecified, without bleeding                           R13.10, Dysphagia, unspecified CPT copyright 2019 American Medical Association. All rights reserved. The codes documented in this report are preliminary and upon coder review may  be revised to meet current compliance requirements. Elon Alas. Abbey Chatters, DO [place] Abbey Chatters, DO 12/07/2020 11:16:06 AM This report has been signed electronically. Number of Addenda: 0

## 2020-12-08 LAB — SURGICAL PATHOLOGY

## 2020-12-15 ENCOUNTER — Encounter (HOSPITAL_COMMUNITY): Payer: Self-pay | Admitting: Internal Medicine

## 2020-12-17 ENCOUNTER — Other Ambulatory Visit: Payer: Self-pay | Admitting: Gastroenterology

## 2020-12-17 DIAGNOSIS — K219 Gastro-esophageal reflux disease without esophagitis: Secondary | ICD-10-CM

## 2021-03-12 ENCOUNTER — Other Ambulatory Visit: Payer: Self-pay | Admitting: Gastroenterology

## 2021-03-12 DIAGNOSIS — K219 Gastro-esophageal reflux disease without esophagitis: Secondary | ICD-10-CM

## 2021-03-28 ENCOUNTER — Encounter: Payer: Self-pay | Admitting: Internal Medicine

## 2021-05-09 ENCOUNTER — Ambulatory Visit: Payer: Medicare Other | Admitting: Gastroenterology

## 2021-08-07 ENCOUNTER — Encounter: Payer: Self-pay | Admitting: Gastroenterology

## 2021-08-07 NOTE — Progress Notes (Deleted)
Referring Provider: Franki Monte, * Primary Care Physician:  Franki Monte, DO Primary GI Physician: Dr. Abbey Chatters  No chief complaint on file.   HPI:   Sharon Spencer is a 71 y.o. female with history of elevated LFTs with normal serologic work-up and liver biopsy in 2014 nonspecific with subtle findings but could be secondary to medication/obesity, relative lack of plasma cells argued against autoimmune, history of GERD with esophagitis, dysphagia with peptic stricture dilated in 2020 and BPE with age-related dysmotility in 2016, and constipation. Colonoscopy 05/2012 with 1 tubular adenoma. Next colonoscopy 05/2022.    She is presenting today for follow-up of GERD, dysphagia, heme positive stool with reports of dark stool, constipation, and change in stool caliber s/p EGD.  Last seen in our office on 10/21/2020.  She reported having very dark stools compared to baseline the week prior to her office visit.  Having reflux symptoms in the evening, taking famotidine at bedtime.  Had previously been on Protonix twice daily but reported her pharmacist told her this was recalled.  Even so, she reported breakthrough symptoms every other day with that regimen, and previously on Nexium and Prevacid.  Ongoing dysphagia with foods getting hung in the upper esophagus and having to regurgitate foods.  Skipping bowel movements a few days at a time taking Linzess 145 mcg as needed, about once a week.  If taking daily, it would cause diarrhea.  Noted stool caliber had become thin or passing small pieces. Also with some mild postprandial lower abdominal cramping that improved with passing gas or having a bowel movement.  Started Dannon yogurt shakes 3 weeks prior.  No weight loss or BRBPR.  She was not interested in a colonoscopy at that time.  On exam, she had heme positive stool, abdominal exam benign.  Planned for CBC, CMP, EGD, omeprazole 40 mg twice daily, Linzess 72 mcg daily and Benefiber daily,  lactose-free diet, Beano before meals, simethicone as needed.  Labs completed with hemoglobin 11.8.  CMP remarkable for mildly elevated AST and ALT at 39 and 30 respectively, creatinine slightly elevated at 1.18, BUN 14, potassium 3.1.  Suspected medications were likely etiology of mildly elevated LFTs; Plaquenil had recently been doubled and she started a new rheumatoid arthritis medication which she could not remember.  She was advised to discuss elevated liver enzymes with rheumatologist and plan to recheck HFP in 8 weeks.  EGD 12/07/2020: Mild Schatzki's ring in distal esophagus s/p dilation and obliteration with biopsy forceps, gastritis biopsied, normal examined duodenum.  Gastric biopsy gastric antral and oxyntic mucosa with slight chronic inflammation, negative for H. pylori.  GE junction biopsy with mild inflammation consistent with reflux.   Today:  Dysphagia:   GERD:   Constipation/change in stool caliber:   Heme positive stool*  Most recent labs 06/29/2021 with AST and ALT within normal limits, slightly elevated alk phos at 131.  AST, ALT, alk phos were all within normal limits in October. Hemoglobin 13.1 in December.    ?diastolic dysfunction- started on Lasix, BNP elevated. Appt with cards in 2/24.  echo which was notable for aortic sclerosis and dilated right atrium.    Past Medical History:  Diagnosis Date   Anxiety    Arthritis    rheumatoid   Carpal tunnel syndrome    Depression    Diabetes mellitus ([place])    Elevated liver enzymes 02/27/2013   AUG 2014 GGT 470 ALT 41 AST 32 ALK PHOS 275    Fibromyalgia  HTN (hypertension)    Lumbago    Migraine    Myalgia and myositis, unspecified    Osteoporosis    PONV (postoperative nausea and vomiting)    Recurrent UTI    specialist at [facility]   Rheumatoid arthritis(714.0)    sepcialist at [facility], Dr. Lynwood Dawley   Sinusitis    TIA (transient ischemic attack) 1991, 2006    Past Surgical History:  Procedure  Laterality Date   BACK SURGERY     lower   BALLOON DILATION N/A 12/07/2020   Procedure: BALLOON DILATION;  Surgeon: Eloise Harman, DO;  [facility];  Service: Endoscopy;  Laterality: N/A;   BIOPSY  12/07/2020   Procedure: BIOPSY;  Surgeon: Eloise Harman, DO;  [facility]: [facility];  Service: Endoscopy;;   COLONOSCOPY  2003   Dr. Rehman--> ischemic colitis   COLONOSCOPY WITH PROPOFOL  06/04/2012   SLF:A sessile polyp measuring 3 mm, tubular adenoma, recommended repeat in 10 years.    ESOPHAGOGASTRODUODENOSCOPY (EGD) WITH ESOPHAGEAL DILATION  06/17/2012   XYI:[id number] ring was found at the gastroesophageal junction/Non-erosive gastritis (inflammation)   ESOPHAGOGASTRODUODENOSCOPY (EGD) WITH PROPOFOL  06/04/2012   MOL:[id number] ring was found at the gastroesophageal junction/SIGMOID ESOPHAGUS(DISTAL)/Non-erosive gastritis (inflammation)   ESOPHAGOGASTRODUODENOSCOPY (EGD) WITH PROPOFOL N/A 10/01/2018   Dr. Oneida Alar: LA Grade B esophagitis, hh, peptic stricture s/p dilation, gastritis (due to Mobic)   ESOPHAGOGASTRODUODENOSCOPY (EGD) WITH PROPOFOL N/A 12/07/2020   Procedure: ESOPHAGOGASTRODUODENOSCOPY (EGD) WITH PROPOFOL;  Surgeon: Eloise Harman, DO;  [facility];  Service: Endoscopy;  Laterality: N/A;  am appt   NASAL SINUS SURGERY     x3   PARTIAL HYSTERECTOMY     POLYPECTOMY  06/04/2012   Procedure: POLYPECTOMY;  Surgeon: Danie Binder, MD;  Location: [facility];  Service: Endoscopy;  Laterality: N/A;   ROTATOR CUFF REPAIR     SAVORY DILATION  06/04/2012   Procedure: SAVORY DILATION;  Surgeon: Danie Binder, MD;  Location: [facility];  Service: Endoscopy;  Laterality: N/A;  82m, 12.8 mm, 14 mm   SAVORY DILATION N/A 10/01/2018   Procedure: SAVORY DILATION;  Surgeon: FDanie Binder MD;  [facility];  Service: Endoscopy;  Laterality: N/A;   SHOULDER SURGERY     left   TONSILLECTOMY     TUBAL LIGATION      Current Outpatient Medications  Medication  Sig Dispense Refill   acetaminophen (TYLENOL) 500 MG tablet Take 1,000 mg by mouth every 8 (eight) hours as needed for moderate pain.     ALPRAZolam (XANAX) 0.5 MG tablet Take 0.5 mg by mouth at bedtime as needed for anxiety.     amLODipine (NORVASC) 10 MG tablet Take 10 mg by mouth daily.     atenolol (TENORMIN) 100 MG tablet Take 100 mg by mouth daily.      CYMBALTA 60 MG capsule Take 60 mg by mouth 2 (two) times daily.     diclofenac Sodium (VOLTAREN) 1 % GEL Apply 1 application topically 3 (three) times daily as needed (pain).     gabapentin (NEURONTIN) 400 MG capsule Take 2 capsules (800 mg total) by mouth at bedtime.     hydroxychloroquine (PLAQUENIL) 200 MG tablet Take 200 mg by mouth 2 (two) times daily.     leflunomide (ARAVA) 20 MG tablet Take 20 mg by mouth daily.     linaclotide (LINZESS) 145 MCG CAPS capsule Take 1 capsule (145 mcg total) by mouth daily as needed. 30 capsule 5  Multiple Vitamins-Minerals (MULTIVITAMIN WITH MINERALS) tablet Take 1 tablet by mouth daily.     omeprazole (PRILOSEC) 40 MG capsule TAKE 1 CAPSULE (40 MG TOTAL) BY MOUTH 2 (TWO) TIMES DAILY BEFORE A MEAL. 180 capsule 3   oxyCODONE (ROXICODONE) 15 MG immediate release tablet Take 15 mg by mouth 3 (three) times daily as needed for pain.     tiZANidine (ZANAFLEX) 2 MG tablet Take 2 mg by mouth at bedtime.     tocilizumab (ACTEMRA) 400 MG/20ML SOLN injection Inject into the vein every 30 (thirty) days. Every 4 weeks     traZODone (DESYREL) 50 MG tablet Take 50-100 mg by mouth at bedtime.     No current facility-administered medications for this visit.   Facility-Administered Medications Ordered in Other Visits  Medication Dose Route Frequency Provider Last Rate Last Admin   mineral oil light 100 % (sterile)    PRN Danie Binder, MD   1 application at 74/25/95 1345    Allergies as of 08/08/2021 - Review Complete 12/07/2020  Allergen Reaction Noted   Carafate [sucralfate]  10/01/2014   Compazine  [prochlorperazine edisylate] Other (See Comments) 05/22/2012   Erythromycin Nausea And Vomiting 09/23/2018   Sulfa antibiotics Nausea And Vomiting 05/22/2012    Family History  Problem Relation Age of Onset   Diabetes Father    Kidney failure Father    Breast cancer Mother    Stroke Mother    Diabetes Other        siblings   Lymphoma Sister    Liver disease Other        history of elevated LFTs in sister too   Colon cancer Neg Hx     Social History   Socioeconomic History   Marital status: Married    Spouse name: Not on file   Number of children: 1   Years of education: Not on file   Highest education level: Not on file  Occupational History   Occupation: disability    Employer: UNEMPLOYED  Tobacco Use   Smoking status: Never   Smokeless tobacco: Never  Vaping Use   Vaping Use: Never used  Substance and Sexual Activity   Alcohol use: No   Drug use: No   Sexual activity: Not on file  Other Topics Concern   Not on file  Social History Narrative   Not on file   Social Determinants of Health   Financial Resource Strain: Not on file  Food Insecurity: Not on file  Transportation Needs: Not on file  Physical Activity: Not on file  Stress: Not on file  Social Connections: Not on file    Review of Systems: Gen: Denies fever, chills, anorexia. Denies fatigue, weakness, weight loss.  CV: Denies chest pain, palpitations, syncope, peripheral edema, and claudication. Resp: Denies dyspnea at rest, cough, wheezing, coughing up blood, and pleurisy. GI: Denies vomiting blood, jaundice, and fecal incontinence.   Denies dysphagia or odynophagia. Derm: Denies rash, itching, dry skin Psych: Denies depression, anxiety, memory loss, confusion. No homicidal or suicidal ideation.  Heme: Denies bruising, bleeding, and enlarged lymph nodes.  Physical Exam: There were no vitals taken for this visit. General:   Alert and oriented. No distress noted. Pleasant and cooperative.   Head:  Normocephalic and atraumatic. Eyes:  Conjuctiva clear without scleral icterus. Mouth:  Oral mucosa pink and moist. Good dentition. No lesions. Heart:  S1, S2 present without murmurs appreciated. Lungs:  Clear to auscultation bilaterally. No wheezes, rales, or rhonchi. No distress.  Abdomen:  +  BS, soft, non-tender and non-distended. No rebound or guarding. No HSM or masses noted. Msk:  Symmetrical without gross deformities. Normal posture. Extremities:  Without edema. Neurologic:  Alert and  oriented x4 Psych:  Alert and cooperative. Normal mood and affect.

## 2021-08-08 ENCOUNTER — Ambulatory Visit: Payer: Medicare Other | Admitting: Gastroenterology

## 2021-08-08 ENCOUNTER — Encounter: Payer: Self-pay | Admitting: Internal Medicine

## 2021-12-31 ENCOUNTER — Other Ambulatory Visit: Payer: Self-pay | Admitting: Gastroenterology

## 2021-12-31 DIAGNOSIS — K219 Gastro-esophageal reflux disease without esophagitis: Secondary | ICD-10-CM

## 2022-01-02 ENCOUNTER — Encounter: Payer: Self-pay | Admitting: Internal Medicine

## 2022-01-02 NOTE — Telephone Encounter (Signed)
Please let patient know I am going to send in a limited 29-monthsupply of refills on her omeprazole as she has not been seen since April 2022.  She needs an office visit for additional refills.  We can do a virtual visit if she has the capability or she can come in the office if she prefers.   Stacey: Please call patient to see if she is able to do a virtual visit on 6/30 or another Friday thereafter for medication refills. If she prefers in person visit, that is ok.

## 2022-01-02 NOTE — Telephone Encounter (Signed)
Spoke to pt, informed she will need an OV for additional refills. Pt voiced understanding.

## 2022-02-20 ENCOUNTER — Other Ambulatory Visit: Payer: Self-pay | Admitting: Gastroenterology

## 2022-02-20 DIAGNOSIS — K5900 Constipation, unspecified: Secondary | ICD-10-CM

## 2022-02-21 NOTE — Telephone Encounter (Signed)
Sending in limited refill.  Needs to keep upcoming appointment in October.

## 2022-02-22 ENCOUNTER — Other Ambulatory Visit: Payer: Self-pay | Admitting: Gastroenterology

## 2022-02-22 DIAGNOSIS — K5900 Constipation, unspecified: Secondary | ICD-10-CM

## 2022-02-22 MED ORDER — LINACLOTIDE 145 MCG PO CAPS: 145.0000 ug | ORAL_CAPSULE | Freq: Every day | ORAL | 3 refills | Status: AC

## 2022-02-22 NOTE — Progress Notes (Signed)
Error

## 2022-02-22 NOTE — Telephone Encounter (Signed)
Noted  

## 2022-02-22 NOTE — Telephone Encounter (Signed)
Spoke to pt, she informed me that Sharon Spencer is working well for her and she would like to stay on that.

## 2022-04-16 NOTE — Progress Notes (Unsigned)
Referring Provider: Franki Monte, * Primary Care Physician:  Franki Monte, DO Primary GI Physician: Dr. Abbey Chatters  No chief complaint on file.   HPI:   Sharon Spencer is a 71 y.o. female  with history of elevated LFTs with normal serologic work-up and liver biopsy in 2014 nonspecific with subtle findings but could be secondary to medication/obesity, relative lack of plasma cells argued against autoimmune. 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 Colonoscopy 05/2012 with 1 tubular adenoma. Next colonoscopy due 05/2022.  She is presenting today for follow-up/medication refills.  Today:  GERD:  Constipation:  Needs colonoscopy.  Past Medical History:  Diagnosis Date   Anxiety    Arthritis    rheumatoid   Carpal tunnel syndrome    Depression    Diabetes mellitus ([place])    Elevated liver enzymes 02/27/2013   AUG 2014 GGT 470 ALT 41 AST 32 ALK PHOS 275    Fibromyalgia    HTN (hypertension)    Lumbago    Migraine    Myalgia and myositis, unspecified    Osteoporosis    PONV (postoperative nausea and vomiting)    Recurrent UTI    specialist at [facility]   Rheumatoid arthritis(714.0)    sepcialist at [facility], Dr. Lynwood Dawley   Sinusitis    TIA (transient ischemic attack) 1991, 2006    Past Surgical History:  Procedure Laterality Date   BACK SURGERY     lower   BALLOON DILATION N/A 12/07/2020   Procedure: BALLOON DILATION;  Surgeon: Eloise Harman, DO;  Location: [facility];  Service: Endoscopy;  Laterality: N/A;   BIOPSY  12/07/2020   Procedure: BIOPSY;  Surgeon: Eloise Harman, DO;  Location: [facility];  Service: Endoscopy;;   COLONOSCOPY  2003   Dr. Rehman--> ischemic colitis   COLONOSCOPY WITH PROPOFOL  06/04/2012   SLF:A sessile polyp measuring 3 mm, tubular adenoma, recommended repeat in 10 years.     ESOPHAGOGASTRODUODENOSCOPY (EGD) WITH ESOPHAGEAL DILATION  06/17/2012   [facility]:ENIDPOEU ring was found at the gastroesophageal junction/Non-erosive gastritis (inflammation)   ESOPHAGOGASTRODUODENOSCOPY (EGD) WITH PROPOFOL  06/04/2012   [facility]:TIRWERXV ring was found at the gastroesophageal junction/SIGMOID ESOPHAGUS(DISTAL)/Non-erosive gastritis (inflammation)   ESOPHAGOGASTRODUODENOSCOPY (EGD) WITH PROPOFOL N/A 10/01/2018   Dr. Oneida Alar: LA Grade B esophagitis, hh, peptic stricture s/p dilation, gastritis (due to Mobic)   ESOPHAGOGASTRODUODENOSCOPY (EGD) WITH PROPOFOL N/A 12/07/2020   Surgeon: Eloise Harman, DO;  Mild Schatzki's ring in distal esophagus s/p dilation and obliteration with biopsy forceps, gastritis biopsied, normal examined duodenum.  GE junction biopsy-mild inflammation consistent with reflux.  Gastric biopsy-slight chronic inflammation, negative for H. pylori.   NASAL SINUS SURGERY     x3   PARTIAL HYSTERECTOMY     POLYPECTOMY  06/04/2012   Procedure: POLYPECTOMY;  Surgeon: Danie Binder, MD;  Location: [facility];  Service: Endoscopy;  Laterality: N/A;   ROTATOR CUFF REPAIR     SAVORY DILATION  06/04/2012   Procedure: SAVORY DILATION;  Surgeon: Danie Binder, MD;  Location: [facility];  Service: Endoscopy;  Laterality: N/A;  4m, 12.8 mm, 14 mm   SAVORY DILATION N/A 10/01/2018   Procedure: SAVORY DILATION;  Surgeon: FDanie Binder MD;  Location: [facility];  Service: Endoscopy;  Laterality: N/A;   SHOULDER SURGERY     left   TONSILLECTOMY     TUBAL LIGATION  Current Outpatient Medications  Medication Sig Dispense Refill   acetaminophen (TYLENOL) 500 MG tablet Take 1,000 mg by mouth every 8 (eight) hours as needed for moderate pain.     ALPRAZolam (XANAX) 0.5 MG tablet Take 0.5 mg by mouth at bedtime as needed for anxiety.     amLODipine (NORVASC) 10 MG tablet Take 10 mg by mouth daily.     atenolol (TENORMIN) 100 MG tablet Take 100 mg by mouth daily.      CYMBALTA 60  MG capsule Take 60 mg by mouth 2 (two) times daily.     diclofenac Sodium (VOLTAREN) 1 % GEL Apply 1 application topically 3 (three) times daily as needed (pain).     gabapentin (NEURONTIN) 400 MG capsule Take 2 capsules (800 mg total) by mouth at bedtime.     hydroxychloroquine (PLAQUENIL) 200 MG tablet Take 200 mg by mouth 2 (two) times daily.     leflunomide (ARAVA) 20 MG tablet Take 20 mg by mouth daily.     linaclotide (LINZESS) 145 MCG CAPS capsule Take 1 capsule (145 mcg total) by mouth daily before breakfast. 30 capsule 3   Multiple Vitamins-Minerals (MULTIVITAMIN WITH MINERALS) tablet Take 1 tablet by mouth daily.     omeprazole (PRILOSEC) 40 MG capsule TAKE 1 CAPSULE (40 MG TOTAL) BY MOUTH 2 (TWO) TIMES DAILY BEFORE A MEAL. 180 capsule 0   oxyCODONE (ROXICODONE) 15 MG immediate release tablet Take 15 mg by mouth 3 (three) times daily as needed for pain.     tiZANidine (ZANAFLEX) 2 MG tablet Take 2 mg by mouth at bedtime.     tocilizumab (ACTEMRA) 400 MG/20ML SOLN injection Inject into the vein every 30 (thirty) days. Every 4 weeks     traZODone (DESYREL) 50 MG tablet Take 50-100 mg by mouth at bedtime.     No current facility-administered medications for this visit.   Facility-Administered Medications Ordered in Other Visits  Medication Dose Route Frequency Provider Last Rate Last Admin   mineral oil light 100 % (sterile)    PRN Danie Binder, MD   1 application  at 73/71/06 1345    Allergies as of 04/17/2022 - Review Complete 12/07/2020  Allergen Reaction Noted   Carafate [sucralfate]  10/01/2014   Compazine [prochlorperazine edisylate] Other (See Comments) 05/22/2012   Erythromycin Nausea And Vomiting 09/23/2018   Sulfa antibiotics Nausea And Vomiting 05/22/2012    Family History  Problem Relation Age of Onset   Diabetes Father    Kidney failure Father    Breast cancer Mother    Stroke Mother    Diabetes Other        siblings   Lymphoma Sister    Liver disease Other         history of elevated LFTs in sister too   Colon cancer Neg Hx     Social History   Socioeconomic History   Marital status: Married    Spouse name: Not on file   Number of children: 1   Years of education: Not on file   Highest education level: Not on file  Occupational History   Occupation: disability    Employer: UNEMPLOYED  Tobacco Use   Smoking status: Never   Smokeless tobacco: Never  Vaping Use   Vaping Use: Never used  Substance and Sexual Activity   Alcohol use: No   Drug use: No   Sexual activity: Not on file  Other Topics Concern   Not on file  Social History Narrative  Not on file   Social Determinants of Health   Financial Resource Strain: Not on file  Food Insecurity: Not on file  Transportation Needs: Not on file  Physical Activity: Not on file  Stress: Not on file  Social Connections: Not on file    Review of Systems: Gen: Denies fever, chills, cold or flulike symptoms, presyncope, syncope.   CV: Denies chest pain, palpitations. Resp: Denies dyspnea, cough. GI: See HPI Heme: See HPI  Physical Exam: There were no vitals taken for this visit. General:   Alert and oriented. No distress noted. Pleasant and cooperative.  Head:  Normocephalic and atraumatic. Eyes:  Conjuctiva clear without scleral icterus. Heart:  S1, S2 present without murmurs appreciated. Lungs:  Clear to auscultation bilaterally. No wheezes, rales, or rhonchi. No distress.  Abdomen:  +BS, soft, non-tender and non-distended. No rebound or guarding. No HSM or masses noted. Msk:  Symmetrical without gross deformities. Normal posture. Extremities:  Without edema. Neurologic:  Alert and  oriented x4 Psych:  Normal mood and affect.    Assessment:     Plan:  ***   Aliene Altes, PA-C [facility] 04/17/2022

## 2022-04-17 ENCOUNTER — Ambulatory Visit (INDEPENDENT_AMBULATORY_CARE_PROVIDER_SITE_OTHER): Payer: Medicare Other | Admitting: Gastroenterology

## 2022-04-17 ENCOUNTER — Encounter: Payer: Self-pay | Admitting: Gastroenterology

## 2022-04-17 VITALS — BP 157/76 | HR 73 | Temp 97.8°F | Ht 61.0 in | Wt 131.4 lb

## 2022-04-17 DIAGNOSIS — K5900 Constipation, unspecified: Secondary | ICD-10-CM | POA: Diagnosis not present

## 2022-04-17 DIAGNOSIS — K219 Gastro-esophageal reflux disease without esophagitis: Secondary | ICD-10-CM | POA: Diagnosis not present

## 2022-04-17 DIAGNOSIS — R1310 Dysphagia, unspecified: Secondary | ICD-10-CM | POA: Diagnosis not present

## 2022-04-17 DIAGNOSIS — Z86010 Personal history of colonic polyps: Secondary | ICD-10-CM | POA: Diagnosis not present

## 2022-04-17 NOTE — Patient Instructions (Addendum)
We will arrange for you to have a colonoscopy with Dr. Abbey Chatters in the near future.   We will provide you with Zofran ( a nausea medication) to take the day you are drinking your prep.   Continue omeprazole 40 mg twice daily 30 minutes before breakfast and dinner.   Follow a GERD diet:  Avoid fried, fatty, greasy, spicy, citrus foods. Avoid caffeine and carbonated beverages. Avoid chocolate. Try eating 4-6 small meals a day rather than 3 large meals. Do not eat within 3 hours of laying down. Prop head of bed up on wood or bricks to create a 6 inch incline.  Swallowing precautions:  Eat slowly, take small bites, chew thoroughly, drink plenty of liquids throughout meals.  Avoid trough textures All meats should be chopped finely.  If something gets hung in your esophagus and will not come up or go down, proceed to the emergency room.    Continue Linzess 145 mcg every day 30 minutes before breakfast.   Start Benefiber 2 teaspoons daily x 2 weeks, then increase to twice daily.   Let know now if you want to change/increase you constipation medications.   We will see you back in about 6 months or sooner if needed.   It was great to see you again today!  Aliene Altes, PA-C [facility]

## 2022-05-17 ENCOUNTER — Telehealth: Payer: Self-pay | Admitting: *Deleted

## 2022-05-17 NOTE — Telephone Encounter (Signed)
We have patient in our call list waiting for December schedule

## 2022-05-17 NOTE — Telephone Encounter (Signed)
Patient is on recall for 10 yr TCS, saw Cyril Mourning 10/6, she has in her note to schedule TCS in December, please call

## 2022-05-22 ENCOUNTER — Other Ambulatory Visit: Payer: Self-pay | Admitting: Gastroenterology

## 2022-05-22 DIAGNOSIS — K219 Gastro-esophageal reflux disease without esophagitis: Secondary | ICD-10-CM

## 2022-05-24 NOTE — Telephone Encounter (Signed)
LMTRC to schedule procedure. 

## 2022-05-24 NOTE — Telephone Encounter (Signed)
Pt returned call and states she wants to wait until after the first of the year to schedule procedure. Will call once get providers schedule.

## 2022-06-15 ENCOUNTER — Telehealth (INDEPENDENT_AMBULATORY_CARE_PROVIDER_SITE_OTHER): Payer: Self-pay | Admitting: *Deleted

## 2022-06-15 NOTE — Telephone Encounter (Signed)
LMOVM to call back to schedule TCS with Dr. Abbey Chatters, ASA 3 in January. Needs linzess 229mg 4 days prior to prep, will need zofran rx when scheduled to let provider know to send in rx per encounter form

## 2022-06-19 NOTE — Telephone Encounter (Signed)
Letter mailed

## 2022-08-18 ENCOUNTER — Other Ambulatory Visit: Payer: Self-pay | Admitting: Gastroenterology

## 2022-08-18 DIAGNOSIS — K219 Gastro-esophageal reflux disease without esophagitis: Secondary | ICD-10-CM

## 2022-08-23 ENCOUNTER — Encounter: Payer: Self-pay | Admitting: Internal Medicine

## 2023-02-24 ENCOUNTER — Other Ambulatory Visit: Payer: Self-pay | Admitting: Gastroenterology

## 2023-02-24 DIAGNOSIS — K219 Gastro-esophageal reflux disease without esophagitis: Secondary | ICD-10-CM

## 2023-06-08 ENCOUNTER — Other Ambulatory Visit (HOSPITAL_COMMUNITY): Payer: Self-pay | Admitting: Adult Health Nurse Practitioner

## 2023-06-08 DIAGNOSIS — I639 Cerebral infarction, unspecified: Secondary | ICD-10-CM

## 2023-06-19 ENCOUNTER — Inpatient Hospital Stay: Payer: Medicare Other | Attending: Oncology | Admitting: Oncology

## 2023-06-19 ENCOUNTER — Encounter: Payer: Self-pay | Admitting: Oncology

## 2023-06-19 ENCOUNTER — Inpatient Hospital Stay: Payer: Medicare Other

## 2023-06-19 ENCOUNTER — Ambulatory Visit (HOSPITAL_COMMUNITY)
Admission: RE | Admit: 2023-06-19 | Discharge: 2023-06-19 | Disposition: A | Discharge status: Home / Self Care | Payer: Medicare Other | Source: Ambulatory Visit | Attending: Adult Health Nurse Practitioner | Admitting: Adult Health Nurse Practitioner

## 2023-06-19 VITALS — BP 124/70 | HR 66 | Temp 96.6°F | Resp 16 | Ht 61.0 in | Wt 129.6 lb

## 2023-06-19 DIAGNOSIS — Z79899 Other long term (current) drug therapy: Secondary | ICD-10-CM | POA: Diagnosis not present

## 2023-06-19 DIAGNOSIS — D72829 Elevated white blood cell count, unspecified: Secondary | ICD-10-CM | POA: Insufficient documentation

## 2023-06-19 DIAGNOSIS — M069 Rheumatoid arthritis, unspecified: Secondary | ICD-10-CM | POA: Insufficient documentation

## 2023-06-19 DIAGNOSIS — D72825 Bandemia: Secondary | ICD-10-CM

## 2023-06-19 DIAGNOSIS — I639 Cerebral infarction, unspecified: Secondary | ICD-10-CM | POA: Insufficient documentation

## 2023-06-19 DIAGNOSIS — D649 Anemia, unspecified: Secondary | ICD-10-CM | POA: Insufficient documentation

## 2023-06-19 LAB — CBC WITH DIFFERENTIAL/PLATELET
Abs Immature Granulocytes: 0.05 10*3/uL (ref 0.00–0.07)
Basophils Absolute: 0.1 10*3/uL (ref 0.0–0.1)
Basophils Relative: 1 %
Eosinophils Absolute: 0.3 10*3/uL (ref 0.0–0.5)
Eosinophils Relative: 3 %
HCT: 34.5 % — ABNORMAL LOW (ref 36.0–46.0)
Hemoglobin: 10.6 g/dL — ABNORMAL LOW (ref 12.0–15.0)
Immature Granulocytes: 1 %
Lymphocytes Relative: 28 %
Lymphs Abs: 2.9 10*3/uL (ref 0.7–4.0)
MCH: 28.4 pg (ref 26.0–34.0)
MCHC: 30.7 g/dL (ref 30.0–36.0)
MCV: 92.5 fL (ref 80.0–100.0)
Monocytes Absolute: 1 10*3/uL (ref 0.1–1.0)
Monocytes Relative: 9 %
Neutro Abs: 6 10*3/uL (ref 1.7–7.7)
Neutrophils Relative %: 58 %
Platelets: 385 10*3/uL (ref 150–400)
RBC: 3.73 MIL/uL — ABNORMAL LOW (ref 3.87–5.11)
RDW: 14.6 % (ref 11.5–15.5)
WBC: 10.2 10*3/uL (ref 4.0–10.5)
nRBC: 0 % (ref 0.0–0.2)

## 2023-06-19 LAB — URIC ACID: Uric Acid, Serum: 5.1 mg/dL (ref 2.5–7.1)

## 2023-06-19 LAB — LACTATE DEHYDROGENASE: LDH: 167 U/L (ref 98–192)

## 2023-06-19 NOTE — Progress Notes (Unsigned)
[facility] at [facility] HEMATOLOGY NEW VISIT  Sharon Duff, DO  REASON FOR REFERRAL: Leukocytosis  HISTORY OF PRESENT ILLNESS: Sharon Spencer 72 y.o. female referred for leukocytosis.She is accompanied by her daughter today. Patient has a past medical history of rheumatoid arthritis and is on and off steroids intermittently. She was recently admitted to the hospital for stroke and since then has been feeling tired and weak. She reports not taking prednisone on a daily basis previously as it made her feel "hyper" but since the discharge from the hospital, has been more consistent. Patient has been on methotrexate previously and was taken off of it for cytopenias. Patients has a fair appetite and has not lost any weight. Denies fever, chills, abdominal pain, night sweats.  Patient is a non smoker, denies alcohol use.   I have reviewed the past medical history, past surgical history, social history and family history with the patient   ALLERGIES:  is allergic to carafate [sucralfate], compazine [prochlorperazine edisylate], erythromycin, and sulfa antibiotics.  MEDICATIONS:  Current Outpatient Medications  Medication Sig Dispense Refill   acetaminophen (TYLENOL) 500 MG tablet Take 1,000 mg by mouth every 8 (eight) hours as needed for moderate pain.     ALPRAZolam (XANAX) 0.5 MG tablet Take 0.5 mg by mouth at bedtime as needed for anxiety.     amLODipine (NORVASC) 10 MG tablet Take 10 mg by mouth daily.     aspirin 81 MG chewable tablet Chew by mouth.     atenolol (TENORMIN) 100 MG tablet Take 100 mg by mouth daily.      atorvastatin (LIPITOR) 40 MG tablet Take by mouth.     cyanocobalamin (VITAMIN B12) 1000 MCG tablet Take by mouth.     CYMBALTA 60 MG capsule Take 60 mg by mouth 2 (two) times daily.     diclofenac Sodium (VOLTAREN) 1 % GEL Apply 1 application topically 3 (three) times daily as needed (pain).     escitalopram (LEXAPRO) 10 MG tablet Take  10 mg by mouth daily.     folic acid (FOLVITE) 1 MG tablet Take 1 mg by mouth daily.     gabapentin (NEURONTIN) 400 MG capsule Take 2 capsules (800 mg total) by mouth at bedtime.     hydroxychloroquine (PLAQUENIL) 200 MG tablet Take 200 mg by mouth 2 (two) times daily.     linaclotide (LINZESS) 145 MCG CAPS capsule Take 1 capsule (145 mcg total) by mouth daily before breakfast. 30 capsule 3   Multiple Vitamins-Minerals (MULTIVITAMIN WITH MINERALS) tablet Take 1 tablet by mouth daily.     omeprazole (PRILOSEC) 40 MG capsule TAKE 1 CAPSULE (40 MG TOTAL) BY MOUTH 2 (TWO) TIMES DAILY BEFORE A MEAL. 180 capsule 1   oxyCODONE (ROXICODONE) 15 MG immediate release tablet Take 15 mg by mouth 3 (three) times daily as needed for pain.     tiZANidine (ZANAFLEX) 2 MG tablet Take 2 mg by mouth at bedtime.     traZODone (DESYREL) 50 MG tablet Take 50-100 mg by mouth at bedtime.     methotrexate (RHEUMATREX) 10 MG tablet Take by mouth. (Patient not taking: Reported on 06/19/2023)     No current facility-administered medications for this visit.   Facility-Administered Medications Ordered in Other Visits  Medication Dose Route Frequency Provider Last Rate Last Admin   mineral oil light 100 % (sterile)    PRN [place] Bali, MD   1 application  at 06/04/12 1345  REVIEW OF SYSTEMS:   Constitutional: Denies fevers, chills or night sweats Eyes: Denies blurriness of vision Ears, nose, mouth, throat, and face: Denies mucositis or sore throat Respiratory: Denies cough, dyspnea or wheezes Cardiovascular: Denies palpitation, chest discomfort or lower extremity swelling Gastrointestinal:  Denies nausea, heartburn or change in bowel habits Skin: Denies abnormal skin rashes Lymphatics: Denies new lymphadenopathy or easy bruising Neurological:Denies numbness, tingling or new weaknesses Behavioral/Psych: Mood is stable, no new changes  All other systems were reviewed with the patient and are negative.  PHYSICAL  EXAMINATION:   Vitals:   06/19/23 1019  BP: 124/70  Pulse: 66  Resp: 16  Temp: (!) 96.6 F (35.9 C)  SpO2: 98%    GENERAL:alert, no distress and comfortable LUNGS: clear to auscultation and percussion with normal breathing effort HEART: regular rate & rhythm and no murmurs and no lower extremity edema ABDOMEN:abdomen soft, non-tender and normal bowel sounds Musculoskeletal:no cyanosis of digits and no clubbing  NEURO: alert & oriented x 3 with fluent speech, no focal motor/sensory deficits  LABORATORY DATA:  I have reviewed the data as listed and labs from [facility] from 05/18/2023 WBC: 27.2, hemoglobin: 11.2, MCV: 92, hematocrit: 35, platelet: 449 Differential: Neutrophils: 22%, lymphocytes: 2.2, monocytes: 2 point, immature granulocytes: 0.3 CMP: Creatinine: 1.34, GFR: 42 normal LFTs ESR: 38 CRP: 49 UA: No infection  Labs from 02/24/2023: CBC: WBC: 11.5, hemoglobin: 11.5, MCV: 85, platelets: 310  Lab Results  Component Value Date   WBC 10.2 06/19/2023   NEUTROABS 6.0 06/19/2023   HGB 10.6 (L) 06/19/2023   HCT 34.5 (L) 06/19/2023   MCV 92.5 06/19/2023   PLT 385 06/19/2023      Component Value Date/Time   NA 140 10/21/2020 1052   NA 136 (A) 03/28/2012 0933   K 3.1 (L) 10/21/2020 1052   CL 101 10/21/2020 1052   CO2 31 10/21/2020 1052   GLUCOSE 103 10/21/2020 1052   BUN 14 10/21/2020 1052   BUN 18 03/28/2012 0933   CREATININE 1.18 (H) 10/21/2020 1052   CALCIUM 9.4 10/21/2020 1052   CALCIUM 9.9 03/28/2012 0933   PROT 7.0 10/21/2020 1052   ALBUMIN 4.0 10/01/2014 1252   AST 39 (H) 10/21/2020 1052   ALT 30 (H) 10/21/2020 1052   ALKPHOS 98 10/01/2014 1252   BILITOT 0.3 10/21/2020 1052   GFRNONAA 46 (L) 10/21/2020 1052   GFRAA 54 (L) 10/21/2020 1052        Chemistry      Component Value Date/Time   NA 140 10/21/2020 1052   NA 136 (A) 03/28/2012 0933   K 3.1 (L) 10/21/2020 1052   CL 101 10/21/2020 1052   CO2 31 10/21/2020 1052   BUN 14 10/21/2020  1052   BUN 18 03/28/2012 0933   CREATININE 1.18 (H) 10/21/2020 1052      Component Value Date/Time   CALCIUM 9.4 10/21/2020 1052   CALCIUM 9.9 03/28/2012 0933   ALKPHOS 98 10/01/2014 1252   AST 39 (H) 10/21/2020 1052   ALT 30 (H) 10/21/2020 1052   BILITOT 0.3 10/21/2020 1052      ASSESSMENT & PLAN:  Patient is a 72yo F with PMH of RA referred for leukocytosis  Leukocytosis Chronic condition with recent exacerbation. Possible causes include inflammation from rheumatoid arthritis, infection, or steroid use. Currently on Prednisone 10mg  intermittently, which may be contributing to elevated count. No B symptoms at this time. -Order blood tests to check for inflammatory markers and flow cytometry -Repeat blood work in six  weeks.  RA (rheumatoid arthritis) (HCC) Chronic condition managed with intermittent Prednisone 10mg  and previously with Methotrexate (currently held). Symptoms improve with steroid use but patient experiences side effects including increased appetite and agitation. -Likely causing leukocytosis and anemia -Consider discussion with rheumatologist regarding management plan.   Orders Placed This Encounter  Procedures   CBC with Differential/Platelet    Standing Status:   Standing    Number of Occurrences:   22    Standing Expiration Date:   06/18/2024   Flow Cytometry, Peripheral Blood (Oncology)   Lactate dehydrogenase    Standing Status:   Future    Number of Occurrences:   1    Standing Expiration Date:   06/18/2024   Uric acid    Standing Status:   Future    Number of Occurrences:   1    Standing Expiration Date:   06/18/2024    The total time spent in the appointment was 40 minutes encounter with patients including review of chart and various tests results, discussions about plan of care and coordination of care plan   All questions were answered. The patient knows to call the clinic with any problems, questions or concerns. No barriers to learning was  detected.   Cindie Crumbly, MD 12/4/20249:53 PM

## 2023-06-20 ENCOUNTER — Other Ambulatory Visit: Payer: Self-pay | Admitting: *Deleted

## 2023-06-20 DIAGNOSIS — D72829 Elevated white blood cell count, unspecified: Secondary | ICD-10-CM | POA: Insufficient documentation

## 2023-06-20 DIAGNOSIS — M069 Rheumatoid arthritis, unspecified: Secondary | ICD-10-CM | POA: Insufficient documentation

## 2023-06-20 DIAGNOSIS — D72825 Bandemia: Secondary | ICD-10-CM

## 2023-06-20 NOTE — Assessment & Plan Note (Signed)
Chronic condition managed with intermittent Prednisone 10mg  and previously with Methotrexate (currently held). Symptoms improve with steroid use but patient experiences side effects including increased appetite and agitation. -Likely causing leukocytosis and anemia -Consider discussion with rheumatologist regarding management plan.

## 2023-06-20 NOTE — Assessment & Plan Note (Signed)
Chronic condition with recent exacerbation. Possible causes include inflammation from rheumatoid arthritis, infection, or steroid use. Currently on Prednisone 10mg  intermittently, which may be contributing to elevated count. No B symptoms at this time. -Order blood tests to check for inflammatory markers and flow cytometry -Repeat blood work in six weeks.

## 2023-06-21 LAB — SURGICAL PATHOLOGY

## 2023-06-25 LAB — FLOW CYTOMETRY

## 2023-07-09 ENCOUNTER — Other Ambulatory Visit (HOSPITAL_COMMUNITY): Payer: Self-pay | Admitting: Neurology

## 2023-07-09 ENCOUNTER — Other Ambulatory Visit (HOSPITAL_COMMUNITY): Payer: Self-pay | Admitting: Adult Health Nurse Practitioner

## 2023-07-09 ENCOUNTER — Encounter (HOSPITAL_COMMUNITY): Payer: Self-pay | Admitting: Adult Health Nurse Practitioner

## 2023-07-09 DIAGNOSIS — I671 Cerebral aneurysm, nonruptured: Secondary | ICD-10-CM

## 2023-07-09 DIAGNOSIS — I639 Cerebral infarction, unspecified: Secondary | ICD-10-CM

## 2023-07-09 DIAGNOSIS — R296 Repeated falls: Secondary | ICD-10-CM

## 2023-07-23 ENCOUNTER — Inpatient Hospital Stay: Payer: Medicare Other

## 2023-07-23 ENCOUNTER — Inpatient Hospital Stay: Payer: Medicare Other | Attending: Hematology | Admitting: Hematology

## 2023-07-23 VITALS — BP 115/65 | HR 72 | Temp 98.2°F | Resp 16 | Wt 133.4 lb

## 2023-07-23 DIAGNOSIS — D509 Iron deficiency anemia, unspecified: Secondary | ICD-10-CM | POA: Diagnosis not present

## 2023-07-23 DIAGNOSIS — D72825 Bandemia: Secondary | ICD-10-CM | POA: Diagnosis not present

## 2023-07-23 DIAGNOSIS — D508 Other iron deficiency anemias: Secondary | ICD-10-CM

## 2023-07-23 DIAGNOSIS — D72829 Elevated white blood cell count, unspecified: Secondary | ICD-10-CM | POA: Diagnosis present

## 2023-07-23 LAB — CBC WITH DIFFERENTIAL/PLATELET
Abs Immature Granulocytes: 0.04 10*3/uL (ref 0.00–0.07)
Basophils Absolute: 0.1 10*3/uL (ref 0.0–0.1)
Basophils Relative: 1 %
Eosinophils Absolute: 0.2 10*3/uL (ref 0.0–0.5)
Eosinophils Relative: 3 %
HCT: 31.6 % — ABNORMAL LOW (ref 36.0–46.0)
Hemoglobin: 9.4 g/dL — ABNORMAL LOW (ref 12.0–15.0)
Immature Granulocytes: 0 %
Lymphocytes Relative: 17 %
Lymphs Abs: 1.5 10*3/uL (ref 0.7–4.0)
MCH: 25.5 pg — ABNORMAL LOW (ref 26.0–34.0)
MCHC: 29.7 g/dL — ABNORMAL LOW (ref 30.0–36.0)
MCV: 85.9 fL (ref 80.0–100.0)
Monocytes Absolute: 1 10*3/uL (ref 0.1–1.0)
Monocytes Relative: 11 %
Neutro Abs: 6.3 10*3/uL (ref 1.7–7.7)
Neutrophils Relative %: 68 %
Platelets: 395 10*3/uL (ref 150–400)
RBC: 3.68 MIL/uL — ABNORMAL LOW (ref 3.87–5.11)
RDW: 14.8 % (ref 11.5–15.5)
WBC: 9 10*3/uL (ref 4.0–10.5)
nRBC: 0 % (ref 0.0–0.2)

## 2023-07-23 LAB — FERRITIN: Ferritin: 23 ng/mL (ref 11–307)

## 2023-07-23 LAB — IRON AND TIBC
Iron: 18 ug/dL — ABNORMAL LOW (ref 28–170)
Saturation Ratios: 4 % — ABNORMAL LOW (ref 10.4–31.8)
TIBC: 431 ug/dL (ref 250–450)
UIBC: 413 ug/dL

## 2023-07-23 LAB — VITAMIN B12: Vitamin B-12: 259 pg/mL (ref 180–914)

## 2023-07-23 LAB — FOLATE: Folate: 24.8 ng/mL (ref >5.9)

## 2023-07-23 NOTE — Patient Instructions (Addendum)
 [facility] at [facility] Discharge Instructions   You were seen and examined today by Dr. Rogers.  He reviewed the results of your labs. Your white blood cell count today is normal. Your hemoglobin is dropping. Dr. MARLA would like to get additional lab work today to look into this. We will check iron studies, a B12, and a folate level today. We will let you know if you need iron infusions or B12/folate supplementation.   We will see you back in 3 months. We will repeat lab work prior to this visit.   Return as scheduled.     Thank you for choosing  Cancer Center at [facility] to provide your oncology and hematology care.  To afford each patient quality time with our provider, please arrive at least 15 minutes before your scheduled appointment time.   If you have a lab appointment with the Cancer Center please come in thru the Main Entrance and check in at the main information desk.  You need to re-schedule your appointment should you arrive 10 or more minutes late.  We strive to give you quality time with our providers, and arriving late affects you and other patients whose appointments are after yours.  Also, if you no show three or more times for appointments you may be dismissed from the clinic at the providers discretion.     Again, thank you for choosing [facility].  Our hope is that these requests will decrease the amount of time that you wait before being seen by our physicians.       _____________________________________________________________  Should you have questions after your visit to [facility], please contact our office at [phone] and follow the prompts.  Our office hours are 8:00 a.m. and 4:30 p.m. Monday - Friday.  Please note that voicemails left after 4:00 p.m. may not be returned until the following business day.  We are closed weekends and major holidays.  You do have access to a nurse 24-7,  just call the main number to the clinic [phone] and do not press any options, hold on the line and a nurse will answer the phone.    For prescription refill requests, have your pharmacy contact our office and allow 72 hours.    Due to Covid, you will need to wear a mask upon entering the hospital. If you do not have a mask, a mask will be given to you at the Main Entrance upon arrival. For doctor visits, patients may have 1 support person age 96 or older with them. For treatment visits, patients can not have anyone with them due to social distancing guidelines and our immunocompromised population.

## 2023-07-26 NOTE — Progress Notes (Signed)
 [facility] [address] [place] [address]    Clinic Day:  07/26/2023  Referring physician: Nonie Johana Sauer, *  Patient Care Team: Nonie Johana Sauer, DO as PCP - General (Family Medicine) Cindie Carlin POUR, DO as Consulting Physician (Gastroenterology)   ASSESSMENT & PLAN:   Assessment:  1.  Leukocytosis: - She was evaluated by Dr. Davonna for elevated white count.  She had elevated white count on and off since July/August 2024 based on labs in [place]. - Repeat CBC in our office on 06/19/2023 showed white count 10.2 with normal differential. - Flow cytometry on 06/19/2023: No monoclonal B-cell or T-cell population. - She does not have any B symptoms. - BMBX (08/25/2020 at [facility]): Normocellular marrow (30%) with TLH.  No morphological involvement by plasma cell neoplasm.  Negative for significant dysplasia.  2.  Rheumatoid arthritis: - Diagnosed with rheumatoid arthritis in her late 109s. - Prior treatment includes Humira (discontinued due to LFTs elevation), Enbrel.  Off methotrexate since October secondary to fevers without infection. - Currently on prednisone 10 mg daily.  3.  Social/family history: - Lives with husband at home.  Sister died of cancer.  Mother had breast cancer.  Niece had breast cancer.  Daughter has multiple myeloma.   Plan:  1.  Leukocytosis: - We reviewed lab results from 06/19/2023 which showed normal LDH and normal white count with normal differential.  We also discussed results of flow cytometry which were negative. - We discussed the increased chance of LGL leukemia and NHL in patients with rheumatoid arthritis related to the disease as well as the treatments.  She does not have any symptoms warranting further workup at this time.  2.  Normocytic anemia: - Hemoglobin was 10.6.  Will check ferritin, iron panel, B12 and folic acid  levels. - She reported stomach irritation when she takes iron tablet. - Will discuss with  patient about the results.  If she cannot tolerate iron tablet, we will consider parenteral iron therapy. - Will see her back in 3 months with repeat CBC, ferritin and iron panel.  3.  Elevated FLC ratio: - Myeloma workup in March at [facility] showed elevated FLC ratio of 1.94 with both kappa light chains and lambda light chains elevated.  Will plan to repeat free light chain ratio at next visit.   Orders Placed This Encounter  Procedures   Iron and TIBC ([facility] ONLY)    Standing Status:   Future    Number of Occurrences:   1    Expected Date:   07/23/2023    Expiration Date:   07/22/2024   Ferritin    Standing Status:   Future    Number of Occurrences:   1    Expected Date:   07/23/2023    Expiration Date:   07/22/2024   Vitamin B12    Standing Status:   Future    Number of Occurrences:   1    Expected Date:   07/23/2023    Expiration Date:   07/22/2024   Folate    Standing Status:   Future    Number of Occurrences:   1    Expected Date:   07/23/2023    Expiration Date:   07/22/2024   CBC with Differential    Standing Status:   Future    Expected Date:   10/15/2023    Expiration Date:   07/22/2024   Lactate dehydrogenase    Standing Status:   Future  Expected Date:   10/15/2023    Expiration Date:   07/22/2024   Iron and TIBC ([facility] ONLY)    Standing Status:   Future    Expected Date:   10/15/2023    Expiration Date:   07/22/2024   Ferritin    Standing Status:   Future    Expected Date:   10/15/2023    Expiration Date:   07/22/2024   Kappa/lambda light chains    Standing Status:   Future    Expected Date:   10/15/2023    Expiration Date:   07/22/2024      Alean Stands, MD   1/9/20256:24 PM  CHIEF COMPLAINT:   Diagnosis: Leukocytosis and iron deficiency anemia  Cancer Staging  No matching staging information was found for the patient.    Prior Therapy: None  Current Therapy: Under workup   HISTORY OF PRESENT ILLNESS:   Oncology  History   No history exists.     INTERVAL HISTORY:   An is a 73 y.o. female presenting to clinic today for follow up of leukocytosis.  She is accompanied by her daughter today. Today, she states that she is doing well overall. Her appetite level is at 25%. Her energy level is at 25%.  PAST MEDICAL HISTORY:   Past Medical History: Past Medical History:  Diagnosis Date   Anxiety    Arthritis    rheumatoid   Carpal tunnel syndrome    Depression    Diabetes mellitus (HCC)    Elevated liver enzymes 02/27/2013   AUG 2014 GGT 470 ALT 41 AST 32 ALK PHOS 275    Fibromyalgia    HTN (hypertension)    Lumbago    Migraine    Myalgia and myositis, unspecified    Osteoporosis    PONV (postoperative nausea and vomiting)    Recurrent UTI    specialist at [facility]   Rheumatoid arthritis(714.0)    sepcialist at [facility] Dr. Mallie Glatter   Sinusitis    TIA (transient ischemic attack) 1991, 2006    Surgical History: Past Surgical History:  Procedure Laterality Date   BACK SURGERY     lower   BALLOON DILATION N/A 12/07/2020   Procedure: BALLOON DILATION;  Surgeon: Cindie Carlin POUR, DO;  Location: [facility];  Service: Endoscopy;  Laterality: N/A;   BIOPSY  12/07/2020   Procedure: BIOPSY;  Surgeon: Cindie Carlin POUR, DO;  Location: [facility];  Service: Endoscopy;;   COLONOSCOPY  2003   Dr. Rehman--> ischemic colitis   COLONOSCOPY WITH PROPOFOL   06/04/2012   SLF:A sessile polyp measuring 3 mm, tubular adenoma, recommended repeat in 10 years.    ESOPHAGOGASTRODUODENOSCOPY (EGD) WITH ESOPHAGEAL DILATION  06/17/2012   DOQ:Dryjusxp ring was found at the gastroesophageal junction/Non-erosive gastritis (inflammation)   ESOPHAGOGASTRODUODENOSCOPY (EGD) WITH PROPOFOL   06/04/2012   DOQ:Dryjusxp ring was found at the gastroesophageal junction/SIGMOID ESOPHAGUS(DISTAL)/Non-erosive gastritis (inflammation)   ESOPHAGOGASTRODUODENOSCOPY (EGD) WITH PROPOFOL  N/A 10/01/2018   Dr. Harvey: LA Grade  B esophagitis, hh, peptic stricture s/p dilation, gastritis (due to Mobic)   ESOPHAGOGASTRODUODENOSCOPY (EGD) WITH PROPOFOL  N/A 12/07/2020   Surgeon: Cindie Carlin POUR, DO;  Mild Schatzki's ring in distal esophagus s/p dilation and obliteration with biopsy forceps, gastritis biopsied, normal examined duodenum.  GE junction biopsy-mild inflammation consistent with reflux.  Gastric biopsy-slight chronic inflammation, negative for H. pylori.   NASAL SINUS SURGERY     x3   PARTIAL HYSTERECTOMY     POLYPECTOMY  06/04/2012   Procedure: POLYPECTOMY;  Surgeon: Margo LITTIE Haddock, MD;  Location: [facility];  Service: Endoscopy;  Laterality: N/A;   ROTATOR CUFF REPAIR     SAVORY DILATION  06/04/2012   Procedure: SAVORY DILATION;  Surgeon: Margo LITTIE Haddock, MD;  Location: [facility];  Service: Endoscopy;  Laterality: N/A;  12mm, 12.8 mm, 14 mm   SAVORY DILATION N/A 10/01/2018   Procedure: SAVORY DILATION;  Surgeon: Haddock Margo LITTIE, MD;  Location: [facility];  Service: Endoscopy;  Laterality: N/A;   SHOULDER SURGERY     left   TONSILLECTOMY     TUBAL LIGATION      Social History: Social History   Socioeconomic History   Marital status: Married    Spouse name: Not on file   Number of children: 1   Years of education: Not on file   Highest education level: Not on file  Occupational History   Occupation: disability    Employer: UNEMPLOYED  Tobacco Use   Smoking status: Never   Smokeless tobacco: Never  Vaping Use   Vaping status: Never Used  Substance and Sexual Activity   Alcohol use: No   Drug use: No   Sexual activity: Not Currently  Other Topics Concern   Not on file  Social History Narrative   Not on file   Social Drivers of Health   Financial Resource Strain: High Risk (05/02/2023)   Received from [facility]   Overall Financial Resource Strain (CARDIA)    Difficulty of Paying Living Expenses: Very hard  Food Insecurity: No Food Insecurity (05/02/2023)   Received  from [facility]   Hunger Vital Sign    Worried About Running Out of Food in the Last Year: Never true    Ran Out of Food in the Last Year: Never true  Transportation Needs: No Transportation Needs (05/02/2023)   Received from [facility] - Transportation    In the past 12 months, has lack of transportation kept you from medical appointments or from getting medications?: No    Lack of Transportation (Non-Medical): No  Physical Activity: Insufficiently Active (10/04/2020)   Received from [facility], [facility]   Exercise Vital Sign    Days of Exercise per Week: 2 days    Minutes of Exercise per Session: 30 min  Stress: Stress Concern Present (10/04/2020)   Received from [facility], [facility]   [employer] [facility] [employer] [facility] - Occupational Stress Questionnaire    Feeling of Stress : Very much  Social Connections: Unknown (10/04/2020)   Received from [facility], [facility]   Social Connection and Isolation Panel [NHANES]    Frequency of Communication with Friends and Family: Once a week    Frequency of Social Gatherings with Friends and Family: Never    Attends Religious Services: Patient declined    Active Member of Clubs or Organizations: No    Attends Banker Meetings: Never    Marital Status: Married  Catering Manager Violence: Not At Risk (06/19/2023)   Humiliation, Afraid, Rape, and Kick questionnaire    Fear of Current or Ex-Partner: No    Emotionally Abused: No    Physically Abused: No    Sexually Abused: No    Family History: Family History  Problem Relation Age of Onset   Diabetes Father    Kidney failure Father    Breast cancer Mother    Stroke  Mother    Diabetes Other        siblings   Lymphoma Sister    Liver disease Other        history of elevated LFTs in sister too   Colon cancer  Neg Hx     Current Medications:  Current Outpatient Medications:    acetaminophen  (TYLENOL ) 500 MG tablet, Take 1,000 mg by mouth every 8 (eight) hours as needed for moderate pain., Disp: , Rfl:    ALPRAZolam (XANAX) 0.5 MG tablet, Take 0.5 mg by mouth at bedtime as needed for anxiety., Disp: , Rfl:    amLODipine (NORVASC) 10 MG tablet, Take 10 mg by mouth daily., Disp: , Rfl:    aspirin 81 MG chewable tablet, Chew by mouth., Disp: , Rfl:    atenolol (TENORMIN) 100 MG tablet, Take 100 mg by mouth daily. , Disp: , Rfl:    atorvastatin (LIPITOR) 40 MG tablet, Take by mouth., Disp: , Rfl:    cyanocobalamin  (VITAMIN B12) 1000 MCG tablet, Take by mouth., Disp: , Rfl:    CYMBALTA 60 MG capsule, Take 60 mg by mouth 2 (two) times daily., Disp: , Rfl:    diclofenac Sodium (VOLTAREN) 1 % GEL, Apply 1 application topically 3 (three) times daily as needed (pain)., Disp: , Rfl:    escitalopram (LEXAPRO) 10 MG tablet, Take 10 mg by mouth daily., Disp: , Rfl:    folic acid  (FOLVITE ) 1 MG tablet, Take 1 mg by mouth daily., Disp: , Rfl:    gabapentin (NEURONTIN) 400 MG capsule, Take 2 capsules (800 mg total) by mouth at bedtime., Disp: , Rfl:    hydroxychloroquine (PLAQUENIL) 200 MG tablet, Take 200 mg by mouth 2 (two) times daily., Disp: , Rfl:    linaclotide  (LINZESS ) 145 MCG CAPS capsule, Take 1 capsule (145 mcg total) by mouth daily before breakfast., Disp: 30 capsule, Rfl: 3   methotrexate (RHEUMATREX) 10 MG tablet, Take by mouth., Disp: , Rfl:    Multiple Vitamins-Minerals (MULTIVITAMIN WITH MINERALS) tablet, Take 1 tablet by mouth daily., Disp: , Rfl:    nitrofurantoin, macrocrystal-monohydrate, (MACROBID) 100 MG capsule, Take 1 capsule by mouth 2 (two) times daily., Disp: , Rfl:    omeprazole  (PRILOSEC) 40 MG capsule, TAKE 1 CAPSULE (40 MG TOTAL) BY MOUTH 2 (TWO) TIMES DAILY BEFORE A MEAL., Disp: 180 capsule, Rfl: 1   oxyCODONE (ROXICODONE) 15 MG immediate release tablet, Take 15 mg by mouth 3 (three)  times daily as needed for pain., Disp: , Rfl:    predniSONE (DELTASONE) 10 MG tablet, Take 10 mg by mouth daily., Disp: , Rfl:    sulfaSALAzine (AZULFIDINE) 500 MG tablet, Take 500 mg by mouth 2 (two) times daily., Disp: , Rfl:    tiZANidine (ZANAFLEX) 2 MG tablet, Take 2 mg by mouth at bedtime., Disp: , Rfl:    traZODone (DESYREL) 50 MG tablet, Take 50-100 mg by mouth at bedtime., Disp: , Rfl:  No current facility-administered medications for this visit.  Facility-Administered Medications Ordered in Other Visits:    mineral oil light 100 % (sterile), , , PRN, Harvey Margo CROME, MD, 1 application  at 06/04/12 1345   Allergies: Allergies  Allergen Reactions   Carafate [Sucralfate]     SEVERE GAS AND BLOATING   Compazine [Prochlorperazine Edisylate] Other (See Comments)    pretzel effect   Erythromycin Nausea And Vomiting   Sulfa Antibiotics Nausea And Vomiting    REVIEW OF SYSTEMS:   Review of Systems  Cardiovascular:  Positive for  palpitations.  Gastrointestinal:  Positive for constipation and nausea.  Neurological:  Positive for dizziness, headaches and numbness.  Psychiatric/Behavioral:  Positive for sleep disturbance.   All other systems reviewed and are negative.    VITALS:   Blood pressure 115/65, pulse 72, temperature 98.2 F (36.8 C), resp. rate 16, weight 133 lb 6.1 oz (60.5 kg), SpO2 94%.  Wt Readings from Last 3 Encounters:  07/23/23 133 lb 6.1 oz (60.5 kg)  06/19/23 129 lb 9.6 oz (58.8 kg)  04/17/22 131 lb 6.4 oz (59.6 kg)    Body mass index is 25.2 kg/m.  Performance status (ECOG): 1 - Symptomatic but completely ambulatory  PHYSICAL EXAM:   Physical Exam Vitals reviewed.  Constitutional:      Appearance: Normal appearance.  Cardiovascular:     Rate and Rhythm: Normal rate and regular rhythm.     Heart sounds: Normal heart sounds.  Pulmonary:     Effort: Pulmonary effort is normal.     Breath sounds: Normal breath sounds.  Abdominal:      Palpations: Abdomen is soft. There is no mass.  Lymphadenopathy:     Cervical: No cervical adenopathy.  Neurological:     Mental Status: She is alert.  Psychiatric:        Mood and Affect: Mood normal.        Behavior: Behavior normal.     LABS:   CBC     Component Value Date/Time   WBC 9.0 07/23/2023 1012   RBC 3.68 (L) 07/23/2023 1012   HGB 9.4 (L) 07/23/2023 1012   HCT 31.6 (L) 07/23/2023 1012   PLT 395 07/23/2023 1012   MCV 85.9 07/23/2023 1012   MCH 25.5 (L) 07/23/2023 1012   MCHC 29.7 (L) 07/23/2023 1012   RDW 14.8 07/23/2023 1012   LYMPHSABS 1.5 07/23/2023 1012   MONOABS 1.0 07/23/2023 1012   EOSABS 0.2 07/23/2023 1012   BASOSABS 0.1 07/23/2023 1012    CMP      Component Value Date/Time   NA 140 10/21/2020 1052   NA 136 (A) 03/28/2012 0933   K 3.1 (L) 10/21/2020 1052   CL 101 10/21/2020 1052   CO2 31 10/21/2020 1052   GLUCOSE 103 10/21/2020 1052   BUN 14 10/21/2020 1052   BUN 18 03/28/2012 0933   CREATININE 1.18 (H) 10/21/2020 1052   CALCIUM 9.4 10/21/2020 1052   CALCIUM 9.9 03/28/2012 0933   PROT 7.0 10/21/2020 1052   ALBUMIN 4.0 10/01/2014 1252   AST 39 (H) 10/21/2020 1052   ALT 30 (H) 10/21/2020 1052   ALKPHOS 98 10/01/2014 1252   BILITOT 0.3 10/21/2020 1052   GFRNONAA 46 (L) 10/21/2020 1052   GFRAA 54 (L) 10/21/2020 1052     No results found for: CEA1, CEA / No results found for: CEA1, CEA No results found for: PSA1 No results found for: CAN199 No results found for: CAN125  No results found for: TOTALPROTELP, ALBUMINELP, A1GS, A2GS, BETS, BETA2SER, GAMS, MSPIKE, SPEI Lab Results  Component Value Date   TIBC 431 07/23/2023   FERRITIN 23 07/23/2023   IRONPCTSAT 4 (L) 07/23/2023   Lab Results  Component Value Date   LDH 167 06/19/2023     STUDIES:   No results found.

## 2023-07-27 NOTE — Progress Notes (Signed)
 Message received from Sharon Katragadda, MD- Please call this patient with labs on 07/23/2023 which showed low iron levels. See if she can tolerate iron tablet 3 times a week until her next visit. If not we have to schedule her for IV iron. Thanks.  Called patient and made aware. She is agreeable with plan to take iron tablet 3 times a week. She will call us  back if she is unable to tolerate the iron to be scheduled for IV iron.

## 2023-08-13 ENCOUNTER — Other Ambulatory Visit: Payer: Medicare Other

## 2023-08-20 ENCOUNTER — Ambulatory Visit: Payer: Medicare Other | Admitting: Oncology

## 2023-08-20 ENCOUNTER — Ambulatory Visit: Payer: Medicare Other | Admitting: Hematology

## 2023-08-20 NOTE — Addendum Note (Signed)
Addended by: Cynda Acres A on: 08/20/2023 02:41 PM   Modules accepted: Orders

## 2023-09-17 ENCOUNTER — Ambulatory Visit (HOSPITAL_COMMUNITY)
Admission: RE | Admit: 2023-09-17 | Discharge: 2023-09-17 | Disposition: A | Discharge status: Home / Self Care | Payer: Medicare Other | Source: Ambulatory Visit | Attending: Family Medicine | Admitting: Family Medicine

## 2023-09-17 DIAGNOSIS — R296 Repeated falls: Secondary | ICD-10-CM | POA: Insufficient documentation

## 2023-09-17 DIAGNOSIS — I639 Cerebral infarction, unspecified: Secondary | ICD-10-CM | POA: Diagnosis present

## 2023-10-22 ENCOUNTER — Other Ambulatory Visit: Payer: Medicare Other

## 2023-10-25 ENCOUNTER — Encounter: Payer: Self-pay | Admitting: *Deleted

## 2023-10-29 ENCOUNTER — Ambulatory Visit: Payer: Medicare Other | Admitting: Oncology

## 2023-10-29 ENCOUNTER — Inpatient Hospital Stay: Attending: Hematology

## 2023-10-29 ENCOUNTER — Other Ambulatory Visit: Payer: Self-pay

## 2023-10-29 ENCOUNTER — Inpatient Hospital Stay: Payer: Medicare Other | Admitting: Hematology

## 2023-10-29 DIAGNOSIS — D508 Other iron deficiency anemias: Secondary | ICD-10-CM

## 2023-10-29 DIAGNOSIS — I129 Hypertensive chronic kidney disease with stage 1 through stage 4 chronic kidney disease, or unspecified chronic kidney disease: Secondary | ICD-10-CM | POA: Diagnosis not present

## 2023-10-29 DIAGNOSIS — D649 Anemia, unspecified: Secondary | ICD-10-CM | POA: Diagnosis not present

## 2023-10-29 DIAGNOSIS — D72829 Elevated white blood cell count, unspecified: Secondary | ICD-10-CM | POA: Insufficient documentation

## 2023-10-29 DIAGNOSIS — N189 Chronic kidney disease, unspecified: Secondary | ICD-10-CM | POA: Diagnosis not present

## 2023-10-29 DIAGNOSIS — R509 Fever, unspecified: Secondary | ICD-10-CM | POA: Insufficient documentation

## 2023-10-29 DIAGNOSIS — D509 Iron deficiency anemia, unspecified: Secondary | ICD-10-CM | POA: Insufficient documentation

## 2023-10-29 DIAGNOSIS — D72825 Bandemia: Secondary | ICD-10-CM

## 2023-10-29 LAB — IRON AND TIBC
Iron: 23 ug/dL — ABNORMAL LOW (ref 28–170)
Saturation Ratios: 6 % — ABNORMAL LOW (ref 10.4–31.8)
TIBC: 380 ug/dL (ref 250–450)
UIBC: 357 ug/dL

## 2023-10-29 LAB — CBC WITH DIFFERENTIAL/PLATELET
Abs Immature Granulocytes: 0.05 10*3/uL (ref 0.00–0.07)
Basophils Absolute: 0 10*3/uL (ref 0.0–0.1)
Basophils Relative: 0 %
Eosinophils Absolute: 0.2 10*3/uL (ref 0.0–0.5)
Eosinophils Relative: 2 %
HCT: 30 % — ABNORMAL LOW (ref 36.0–46.0)
Hemoglobin: 8.7 g/dL — ABNORMAL LOW (ref 12.0–15.0)
Immature Granulocytes: 1 %
Lymphocytes Relative: 18 %
Lymphs Abs: 1.6 10*3/uL (ref 0.7–4.0)
MCH: 22.4 pg — ABNORMAL LOW (ref 26.0–34.0)
MCHC: 29 g/dL — ABNORMAL LOW (ref 30.0–36.0)
MCV: 77.1 fL — ABNORMAL LOW (ref 80.0–100.0)
Monocytes Absolute: 0.7 10*3/uL (ref 0.1–1.0)
Monocytes Relative: 8 %
Neutro Abs: 6.1 10*3/uL (ref 1.7–7.7)
Neutrophils Relative %: 71 %
Platelets: 408 10*3/uL — ABNORMAL HIGH (ref 150–400)
RBC: 3.89 MIL/uL (ref 3.87–5.11)
RDW: 19.9 % — ABNORMAL HIGH (ref 11.5–15.5)
WBC: 8.6 10*3/uL (ref 4.0–10.5)
nRBC: 0 % (ref 0.0–0.2)

## 2023-10-29 LAB — FERRITIN: Ferritin: 11 ng/mL (ref 11–307)

## 2023-10-29 LAB — LACTATE DEHYDROGENASE: LDH: 164 U/L (ref 98–192)

## 2023-10-30 LAB — KAPPA/LAMBDA LIGHT CHAINS
Kappa free light chain: 65.6 mg/L — ABNORMAL HIGH (ref 3.3–19.4)
Kappa, lambda light chain ratio: 1.67 — ABNORMAL HIGH (ref 0.26–1.65)
Lambda free light chains: 39.3 mg/L — ABNORMAL HIGH (ref 5.7–26.3)

## 2023-11-11 NOTE — Progress Notes (Signed)
 [facility] [address] [place] [address]    Clinic Day:  11/11/2023  Referring physician: Mosie Spencer, *  Patient Care Team: Sharon Argue, DO as PCP - General (Family Medicine) Vinetta Greening, DO as Consulting Physician (Gastroenterology)   ASSESSMENT & PLAN:   Assessment: 1.  Leukocytosis: - She was evaluated by Dr. Orvis Blare for elevated white count.  She had elevated white count on and off since July/August 2024 based on labs in [place]. - Repeat CBC in our office on 06/19/2023 showed white count 10.2 with normal differential. - Flow cytometry on 06/19/2023: No monoclonal B-cell or T-cell population. - She does not have any B symptoms. - BMBX (08/25/2020 at [facility]): Normocellular marrow (30%) with TLH.  No morphological involvement by plasma cell neoplasm.  Negative for significant dysplasia.   2.  Rheumatoid arthritis: - Diagnosed with rheumatoid arthritis in her late 37s. - Prior treatment includes Humira (discontinued due to LFTs elevation), Enbrel.  Off methotrexate since October secondary to fevers without infection. - Currently on prednisone 10 mg daily.   3.  Social/family history: - Lives with husband at home.  Sister died of cancer.  Mother had breast cancer.  Niece had breast cancer.  Daughter has multiple myeloma.    Plan: 1.  Leukocytosis: - We reviewed lab results from 06/19/2023 which showed normal LDH and normal white count with normal differential.  We also discussed results of flow cytometry which were negative. - We discussed the increased chance of LGL leukemia and NHL in patients with rheumatoid arthritis related to the disease as well as the treatments.  She does not have any symptoms warranting further workup at this time.   2.  Normocytic anemia: - Hemoglobin was 10.6.  Will check ferritin, iron panel, B12 and folic acid  levels. - She reported stomach irritation when she takes iron tablet. - Will discuss with  patient about the results.  If she cannot tolerate iron tablet, we will consider parenteral iron therapy. - Will see her back in 3 months with repeat CBC, ferritin and iron panel.   3.  Elevated FLC ratio: - Myeloma workup in March at [facility] showed elevated FLC ratio of 1.94 with both kappa light chains and lambda light chains elevated.  Will plan to repeat free light chain ratio at next visit.    No orders of the defined types were placed in this encounter.     I,Katie Daubenspeck,acting as a Neurosurgeon for Paulett Boros, MD.,have documented all relevant documentation on the behalf of Paulett Boros, MD,as directed by  Paulett Boros, MD while in the presence of Paulett Boros, MD.   ***  Katie Daubenspeck   4/27/20257:57 PM  CHIEF COMPLAINT:   Diagnosis: leukocytosis and IDA  Cancer Staging  No matching staging information was found for the patient.    Prior Therapy: ***  Current Therapy:  ***   HISTORY OF PRESENT ILLNESS:   Oncology History   No history exists.     INTERVAL HISTORY:   Sharon Spencer is a 73 y.o. female presenting to clinic today for follow up of leukocytosis and IDA. She was last seen by me on 07/23/23.  Today, she states that she is doing well overall. Her appetite level is at ***%. Her energy level is at ***%.  PAST MEDICAL HISTORY:   Past Medical History: Past Medical History:  Diagnosis Date   Anxiety    Arthritis    rheumatoid   Carpal tunnel syndrome  Depression    Diabetes mellitus (HCC)    Elevated liver enzymes 02/27/2013   AUG 2014 GGT 470 ALT 41 AST 32 ALK PHOS 275    Fibromyalgia    HTN (hypertension)    Lumbago    Migraine    Myalgia and myositis, unspecified    Osteoporosis    PONV (postoperative nausea and vomiting)    Recurrent UTI    specialist at [facility]   Rheumatoid arthritis(714.0)    sepcialist at [facility], Dr. Marylin So   Sinusitis    TIA (transient ischemic attack) 1991, 2006    Surgical  History: Past Surgical History:  Procedure Laterality Date   BACK SURGERY     lower   BALLOON DILATION N/A 12/07/2020   Procedure: BALLOON DILATION;  Surgeon: Vinetta Greening, DO;  Location: [facility];  Service: Endoscopy;  Laterality: N/A;   BIOPSY  12/07/2020   Procedure: BIOPSY;  Surgeon: Vinetta Greening, DO;  Location: [facility];  Service: Endoscopy;;   COLONOSCOPY  2003   Dr. Rehman--> ischemic colitis   COLONOSCOPY WITH PROPOFOL   06/04/2012   SLF:A sessile polyp measuring 3 mm, tubular adenoma, recommended repeat in 10 years.    ESOPHAGOGASTRODUODENOSCOPY (EGD) WITH ESOPHAGEAL DILATION  06/17/2012   MVH:QIONGEXB ring was found at the gastroesophageal junction/Non-erosive gastritis (inflammation)   ESOPHAGOGASTRODUODENOSCOPY (EGD) WITH PROPOFOL   06/04/2012   MWU:XLKGMWNU ring was found at the gastroesophageal junction/SIGMOID ESOPHAGUS(DISTAL)/Non-erosive gastritis (inflammation)   ESOPHAGOGASTRODUODENOSCOPY (EGD) WITH PROPOFOL  N/A 10/01/2018   Dr. Nolene Baumgarten: LA Grade B esophagitis, hh, peptic stricture s/p dilation, gastritis (due to Mobic)   ESOPHAGOGASTRODUODENOSCOPY (EGD) WITH PROPOFOL  N/A 12/07/2020   Surgeon: Vinetta Greening, DO;  Mild Schatzki's ring in distal esophagus s/p dilation and obliteration with biopsy forceps, gastritis biopsied, normal examined duodenum.  GE junction biopsy-mild inflammation consistent with reflux.  Gastric biopsy-slight chronic inflammation, negative for H. pylori.   NASAL SINUS SURGERY     x3   PARTIAL HYSTERECTOMY     POLYPECTOMY  06/04/2012   Procedure: POLYPECTOMY;  Surgeon: Alyce Jubilee, MD;  Location: [facility];  Service: Endoscopy;  Laterality: N/A;   ROTATOR CUFF REPAIR     SAVORY DILATION  06/04/2012   Procedure: SAVORY DILATION;  Surgeon: Alyce Jubilee, MD;  Location: [facility];  Service: Endoscopy;  Laterality: N/A;  12mm, 12.8 mm, 14 mm   SAVORY DILATION N/A 10/01/2018   Procedure: SAVORY DILATION;  Surgeon: Alyce Jubilee,  MD;  Location: [facility];  Service: Endoscopy;  Laterality: N/A;   SHOULDER SURGERY     left   TONSILLECTOMY     TUBAL LIGATION      Social History: Social History   Socioeconomic History   Marital status: Married    Spouse name: Not on file   Number of children: 1   Years of education: Not on file   Highest education level: Not on file  Occupational History   Occupation: disability    Employer: UNEMPLOYED  Tobacco Use   Smoking status: Never   Smokeless tobacco: Never  Vaping Use   Vaping status: Never Used  Substance and Sexual Activity   Alcohol use: No   Drug use: No   Sexual activity: Not Currently  Other Topics Concern   Not on file  Social History Narrative   Not on file   Social Drivers of Health   Financial Resource Strain: Low Risk  (10/06/2023)   Received from [facility]   Overall Financial  Resource Strain (CARDIA)    Difficulty of Paying Living Expenses: Not hard at all  Food Insecurity: No Food Insecurity (10/06/2023)   Received from [facility]   Hunger Vital Sign    Worried About Running Out of Food in the Last Year: Never true    Ran Out of Food in the Last Year: Never true  Transportation Needs: No Transportation Needs (10/06/2023)   Received from [facility] - Transportation    In the past 12 months, has lack of transportation kept you from medical appointments or from getting medications?: No    Lack of Transportation (Non-Medical): No  Physical Activity: Insufficiently Active (10/04/2020)   Received from [facility], [facility]   Exercise Vital Sign    Days of Exercise per Week: 2 days    Minutes of Exercise per Session: 30 min  Stress: Stress Concern Present (10/04/2020)   Received from [facility], [facility]   [employer] of Occupational Health - Occupational Stress Questionnaire    Feeling of  Stress : Very much  Social Connections: Unknown (10/04/2020)   Received from [facility], [facility]   Social Connection and Isolation Panel [NHANES]    Frequency of Communication with Friends and Family: Once a week    Frequency of Social Gatherings with Friends and Family: Never    Attends Religious Services: Patient declined    Active Member of Clubs or Organizations: No    Attends Banker Meetings: Never    Marital Status: Married  Catering manager Violence: Not At Risk (06/19/2023)   Humiliation, Afraid, Rape, and Kick questionnaire    Fear of Current or Ex-Partner: No    Emotionally Abused: No    Physically Abused: No    Sexually Abused: No    Family History: Family History  Problem Relation Age of Onset   Diabetes Father    Kidney failure Father    Breast cancer Mother    Stroke Mother    Diabetes Other        siblings   Lymphoma Sister    Liver disease Other        history of elevated LFTs in sister too   Colon cancer Neg Hx     Current Medications:  Current Outpatient Medications:    acetaminophen  (TYLENOL ) 500 MG tablet, Take 1,000 mg by mouth every 8 (eight) hours as needed for moderate pain., Disp: , Rfl:    ALPRAZolam (XANAX) 0.5 MG tablet, Take 0.5 mg by mouth at bedtime as needed for anxiety., Disp: , Rfl:    amLODipine (NORVASC) 10 MG tablet, Take 10 mg by mouth daily., Disp: , Rfl:    aspirin 81 MG chewable tablet, Chew by mouth., Disp: , Rfl:    atenolol (TENORMIN) 100 MG tablet, Take 100 mg by mouth daily. , Disp: , Rfl:    atorvastatin (LIPITOR) 40 MG tablet, Take by mouth., Disp: , Rfl:    cyanocobalamin  (VITAMIN B12) 1000 MCG tablet, Take by mouth., Disp: , Rfl:    CYMBALTA 60 MG capsule, Take 60 mg by mouth 2 (two) times daily., Disp: , Rfl:    diclofenac Sodium (VOLTAREN) 1 % GEL, Apply 1 application topically 3 (three) times daily as needed (pain)., Disp: , Rfl:    escitalopram (LEXAPRO) 10 MG  tablet, Take 10 mg by mouth daily., Disp: , Rfl:    folic acid  (FOLVITE ) 1  MG tablet, Take 1 mg by mouth daily., Disp: , Rfl:    gabapentin (NEURONTIN) 400 MG capsule, Take 2 capsules (800 mg total) by mouth at bedtime., Disp: , Rfl:    hydroxychloroquine (PLAQUENIL) 200 MG tablet, Take 200 mg by mouth 2 (two) times daily., Disp: , Rfl:    linaclotide  (LINZESS ) 145 MCG CAPS capsule, Take 1 capsule (145 mcg total) by mouth daily before breakfast., Disp: 30 capsule, Rfl: 3   methotrexate (RHEUMATREX) 10 MG tablet, Take by mouth., Disp: , Rfl:    Multiple Vitamins-Minerals (MULTIVITAMIN WITH MINERALS) tablet, Take 1 tablet by mouth daily., Disp: , Rfl:    omeprazole  (PRILOSEC) 40 MG capsule, TAKE 1 CAPSULE (40 MG TOTAL) BY MOUTH 2 (TWO) TIMES DAILY BEFORE A MEAL., Disp: 180 capsule, Rfl: 1   oxyCODONE (ROXICODONE) 15 MG immediate release tablet, Take 15 mg by mouth 3 (three) times daily as needed for pain., Disp: , Rfl:    predniSONE (DELTASONE) 10 MG tablet, Take 10 mg by mouth daily., Disp: , Rfl:    sulfaSALAzine (AZULFIDINE) 500 MG tablet, Take 500 mg by mouth 2 (two) times daily., Disp: , Rfl:    tiZANidine (ZANAFLEX) 2 MG tablet, Take 2 mg by mouth at bedtime., Disp: , Rfl:    traZODone (DESYREL) 50 MG tablet, Take 50-100 mg by mouth at bedtime., Disp: , Rfl:  No current facility-administered medications for this visit.  Facility-Administered Medications Ordered in Other Visits:    mineral oil light 100 % (sterile), , , PRN, Alyce Jubilee, MD, 1 application  at 06/04/12 1345   Allergies: Allergies  Allergen Reactions   Carafate [Sucralfate]     SEVERE GAS AND BLOATING   Compazine [Prochlorperazine Edisylate] Other (See Comments)    "pretzel effect"   Erythromycin Nausea And Vomiting   Sulfa Antibiotics Nausea And Vomiting    REVIEW OF SYSTEMS:   Review of Systems  Constitutional:  Negative for chills, fatigue and fever.  HENT:   Negative for lump/mass, mouth sores, nosebleeds,  sore throat and trouble swallowing.   Eyes:  Negative for eye problems.  Respiratory:  Negative for cough and shortness of breath.   Cardiovascular:  Negative for chest pain, leg swelling and palpitations.  Gastrointestinal:  Negative for abdominal pain, constipation, diarrhea, nausea and vomiting.  Genitourinary:  Negative for bladder incontinence, difficulty urinating, dysuria, frequency, hematuria and nocturia.   Musculoskeletal:  Negative for arthralgias, back pain, flank pain, myalgias and neck pain.  Skin:  Negative for itching and rash.  Neurological:  Negative for dizziness, headaches and numbness.  Hematological:  Does not bruise/bleed easily.  Psychiatric/Behavioral:  Negative for depression, sleep disturbance and suicidal ideas. The patient is not nervous/anxious.   All other systems reviewed and are negative.    VITALS:   There were no vitals taken for this visit.  Wt Readings from Last 3 Encounters:  07/23/23 133 lb 6.1 oz (60.5 kg)  06/19/23 129 lb 9.6 oz (58.8 kg)  04/17/22 131 lb 6.4 oz (59.6 kg)    There is no height or weight on file to calculate BMI.  Performance status (ECOG): {CHL ONC [id number]  PHYSICAL EXAM:   Physical Exam Vitals and nursing note reviewed. Exam conducted with a chaperone present.  Constitutional:      Appearance: Normal appearance.  Cardiovascular:     Rate and Rhythm: Normal rate and regular rhythm.     Pulses: Normal pulses.     Heart sounds: Normal heart sounds.  Pulmonary:  Effort: Pulmonary effort is normal.     Breath sounds: Normal breath sounds.  Abdominal:     Palpations: Abdomen is soft. There is no hepatomegaly, splenomegaly or mass.     Tenderness: There is no abdominal tenderness.  Musculoskeletal:     Right lower leg: No edema.     Left lower leg: No edema.  Lymphadenopathy:     Cervical: No cervical adenopathy.     Right cervical: No superficial, deep or posterior cervical adenopathy.    Left cervical: No  superficial, deep or posterior cervical adenopathy.     Upper Body:     Right upper body: No supraclavicular or axillary adenopathy.     Left upper body: No supraclavicular or axillary adenopathy.  Neurological:     General: No focal deficit present.     Mental Status: She is alert and oriented to person, place, and time.  Psychiatric:        Mood and Affect: Mood normal.        Behavior: Behavior normal.     LABS:   CBC     Component Value Date/Time   WBC 8.6 10/29/2023 1116   RBC 3.89 10/29/2023 1116   HGB 8.7 (L) 10/29/2023 1116   HCT 30.0 (L) 10/29/2023 1116   PLT 408 (H) 10/29/2023 1116   MCV 77.1 (L) 10/29/2023 1116   MCH 22.4 (L) 10/29/2023 1116   MCHC 29.0 (L) 10/29/2023 1116   RDW 19.9 (H) 10/29/2023 1116   LYMPHSABS 1.6 10/29/2023 1116   MONOABS 0.7 10/29/2023 1116   EOSABS 0.2 10/29/2023 1116   BASOSABS 0.0 10/29/2023 1116    CMP      Component Value Date/Time   NA 140 10/21/2020 1052   NA 136 (A) 03/28/2012 0933   K 3.1 (L) 10/21/2020 1052   CL 101 10/21/2020 1052   CO2 31 10/21/2020 1052   GLUCOSE 103 10/21/2020 1052   BUN 14 10/21/2020 1052   BUN 18 03/28/2012 0933   CREATININE 1.18 (H) 10/21/2020 1052   CALCIUM 9.4 10/21/2020 1052   CALCIUM 9.9 03/28/2012 0933   PROT 7.0 10/21/2020 1052   ALBUMIN 4.0 10/01/2014 1252   AST 39 (H) 10/21/2020 1052   ALT 30 (H) 10/21/2020 1052   ALKPHOS 98 10/01/2014 1252   BILITOT 0.3 10/21/2020 1052   GFRNONAA 46 (L) 10/21/2020 1052   GFRAA 54 (L) 10/21/2020 1052     No results found for: "CEA1", "CEA" / No results found for: "CEA1", "CEA" No results found for: "PSA1" No results found for: "JXB147" No results found for: "CAN125"  No results found for: "TOTALPROTELP", "ALBUMINELP", "A1GS", "A2GS", "BETS", "BETA2SER", "GAMS", "MSPIKE", "SPEI" Lab Results  Component Value Date   TIBC 380 10/29/2023   TIBC 431 07/23/2023   FERRITIN 11 10/29/2023   FERRITIN 23 07/23/2023   IRONPCTSAT 6 (L) 10/29/2023    IRONPCTSAT 4 (L) 07/23/2023   Lab Results  Component Value Date   LDH 164 10/29/2023   LDH 167 06/19/2023     STUDIES:   No results found.

## 2023-11-12 ENCOUNTER — Inpatient Hospital Stay (HOSPITAL_BASED_OUTPATIENT_CLINIC_OR_DEPARTMENT_OTHER): Admitting: Hematology

## 2023-11-12 VITALS — BP 125/68 | HR 70 | Temp 98.1°F | Resp 19 | Wt 130.0 lb

## 2023-11-12 DIAGNOSIS — D508 Other iron deficiency anemias: Secondary | ICD-10-CM | POA: Diagnosis not present

## 2023-11-12 DIAGNOSIS — D509 Iron deficiency anemia, unspecified: Secondary | ICD-10-CM | POA: Insufficient documentation

## 2023-11-12 DIAGNOSIS — R300 Dysuria: Secondary | ICD-10-CM | POA: Diagnosis not present

## 2023-11-12 LAB — URINALYSIS, ROUTINE W REFLEX MICROSCOPIC
Bacteria, UA: NONE SEEN
Bilirubin Urine: NEGATIVE
Glucose, UA: NEGATIVE mg/dL
Ketones, ur: NEGATIVE mg/dL
Leukocytes,Ua: NEGATIVE
Nitrite: NEGATIVE
Protein, ur: 30 mg/dL — AB
Specific Gravity, Urine: 1.013 (ref 1.005–1.030)
pH: 5 (ref 5.0–8.0)

## 2023-11-12 NOTE — Patient Instructions (Signed)
 [facility] at [facility] Discharge Instructions   You were seen and examined today by Dr. Cheree Cords.  He reviewed the results of your lab work which are mostly normal/stable. Your hemoglobin is low and your iron is low. We will arrange for you to have an iron infusion to help correct both the hemoglobin and iron.   We will see you back in 6-8 weeks. We will repeat lab work prior to this visit.    Return as scheduled.    Thank you for choosing [facility] at [facility] to provide your oncology and hematology care.  To afford each patient quality time with our provider, please arrive at least 15 minutes before your scheduled appointment time.   If you have a lab appointment with the Cancer Center please come in thru the Main Entrance and check in at the main information desk.  You need to re-schedule your appointment should you arrive 10 or more minutes late.  We strive to give you quality time with our providers, and arriving late affects you and other patients whose appointments are after yours.  Also, if you no show three or more times for appointments you may be dismissed from the clinic at the providers discretion.     Again, thank you for choosing [facility].  Our hope is that these requests will decrease the amount of time that you wait before being seen by our physicians.       _____________________________________________________________  Should you have questions after your visit to [facility], please contact our office at [phone] and follow the prompts.  Our office hours are 8:00 a.m. and 4:30 p.m. Monday - Friday.  Please note that voicemails left after 4:00 p.m. may not be returned until the following business day.  We are closed weekends and major holidays.  You do have access to a nurse 24-7, just call the main number to the clinic [phone] and do not press any options, hold on the line and a  nurse will answer the phone.    For prescription refill requests, have your pharmacy contact our office and allow 72 hours.    Due to Covid, you will need to wear a mask upon entering the hospital. If you do not have a mask, a mask will be given to you at the Main Entrance upon arrival. For doctor visits, patients may have 1 support person age 76 or older with them. For treatment visits, patients can not have anyone with them due to social distancing guidelines and our immunocompromised population.

## 2023-11-13 LAB — URINE CULTURE: Culture: NO GROWTH

## 2023-11-14 ENCOUNTER — Inpatient Hospital Stay

## 2023-11-14 VITALS — BP 124/74 | HR 68 | Temp 97.6°F | Resp 16

## 2023-11-14 DIAGNOSIS — D509 Iron deficiency anemia, unspecified: Secondary | ICD-10-CM | POA: Diagnosis not present

## 2023-11-14 DIAGNOSIS — D508 Other iron deficiency anemias: Secondary | ICD-10-CM

## 2023-11-14 MED ORDER — CETIRIZINE HCL 10 MG/ML IV SOLN
10.0000 mg | Freq: Once | INTRAVENOUS | Status: AC
  Administered 2023-11-14: 10 mg via INTRAVENOUS
  Filled 2023-11-14: qty 1

## 2023-11-14 MED ORDER — SODIUM CHLORIDE 0.9 % IV SOLN: Freq: Once | INTRAVENOUS | Status: AC

## 2023-11-14 MED ORDER — SODIUM CHLORIDE 0.9 % IV SOLN
1000.0000 mg | Freq: Once | INTRAVENOUS | Status: AC
  Administered 2023-11-14: 1000 mg via INTRAVENOUS
  Filled 2023-11-14: qty 1000

## 2023-11-14 MED ORDER — ACETAMINOPHEN 325 MG PO TABS
650.0000 mg | ORAL_TABLET | Freq: Once | ORAL | Status: AC
  Administered 2023-11-14: 650 mg via ORAL
  Filled 2023-11-14: qty 2

## 2023-11-14 NOTE — Progress Notes (Signed)
 Patient tolerated iron infusion with no complaints voiced.  Peripheral IV site clean and dry with good blood return noted before and after infusion.  Band aid applied. Pt observed for 30 minutes post iron infusion without any complications. RN educated pt on the importance of notifying the clinic, pt verbalized understanding. VSS with discharge and left in satisfactory condition with no s/s of distress noted. All follow ups as scheduled.   Lynnita Somma [employer]

## 2023-11-14 NOTE — Patient Instructions (Signed)

## 2023-12-31 ENCOUNTER — Inpatient Hospital Stay

## 2023-12-31 NOTE — Addendum Note (Signed)
 Addended by: Sabas Cradle A on: 12/31/2023 09:26 AM   Modules accepted: Orders

## 2024-01-07 ENCOUNTER — Inpatient Hospital Stay: Attending: Hematology | Admitting: Hematology

## 2024-01-07 VITALS — BP 216/90 | HR 77 | Temp 96.6°F | Resp 20

## 2024-01-07 DIAGNOSIS — Z807 Family history of other malignant neoplasms of lymphoid, hematopoietic and related tissues: Secondary | ICD-10-CM | POA: Diagnosis not present

## 2024-01-07 DIAGNOSIS — M069 Rheumatoid arthritis, unspecified: Secondary | ICD-10-CM | POA: Diagnosis not present

## 2024-01-07 DIAGNOSIS — D508 Other iron deficiency anemias: Secondary | ICD-10-CM

## 2024-01-07 DIAGNOSIS — D72829 Elevated white blood cell count, unspecified: Secondary | ICD-10-CM | POA: Insufficient documentation

## 2024-01-07 DIAGNOSIS — R748 Abnormal levels of other serum enzymes: Secondary | ICD-10-CM | POA: Diagnosis not present

## 2024-01-07 DIAGNOSIS — Z809 Family history of malignant neoplasm, unspecified: Secondary | ICD-10-CM | POA: Diagnosis not present

## 2024-01-07 DIAGNOSIS — Z79899 Other long term (current) drug therapy: Secondary | ICD-10-CM | POA: Insufficient documentation

## 2024-01-07 DIAGNOSIS — Z803 Family history of malignant neoplasm of breast: Secondary | ICD-10-CM | POA: Diagnosis not present

## 2024-01-07 DIAGNOSIS — D649 Anemia, unspecified: Secondary | ICD-10-CM | POA: Diagnosis not present

## 2024-01-07 NOTE — Patient Instructions (Signed)

## 2024-01-07 NOTE — Progress Notes (Signed)
 [facility] [address], [place] [address]    Clinic Day:  01/07/2024  Referring physician: Nonie Johana Spencer, *  Patient Care Team: Sharon Spencer as PCP - General (Family Medicine) Sharon Carlin POUR, Spencer as Consulting Physician (Gastroenterology)   ASSESSMENT & PLAN:   Assessment: 1.  Leukocytosis: - She was evaluated by Dr. Davonna for elevated white count.  She had elevated white count on and off since July/August 2024 based on labs in [place]. - Repeat CBC in our office on 06/19/2023 showed white count 10.2 with normal differential. - Flow cytometry on 06/19/2023: No monoclonal B-cell or T-cell population. - She does not have any B symptoms. - BMBX (08/25/2020 at [facility]): Normocellular marrow (30%) with TLH.  No morphological involvement by plasma cell neoplasm.  Negative for significant dysplasia.   2.  Rheumatoid arthritis: - Diagnosed with rheumatoid arthritis in her late 103s. - Prior treatment includes Humira (discontinued due to LFTs elevation), Enbrel.  Off methotrexate since October secondary to fevers without infection. - Currently on prednisone 10 mg daily.   3.  Social/family history: - Lives with husband at home.  Sister died of cancer.  Mother had breast cancer.  Niece had breast cancer.  Daughter has multiple myeloma.    Plan: 1.  Leukocytosis: - Previous flow cytometry was negative for LGL.  Given her history of rheumatoid arthritis, she has increased risk of LGL/NHL.  Will continue to monitor.  WBC on 6/17 normal.   2.  Normocytic anemia: - Monoferric  on 11/14/23. Energy improved. Hb on 01/02/24 improved to 12.2. -Will repeat Ferritin and CBC in 6 months.   3.  Elevated FLC ratio: - Myeloma workup in March at [facility] showed elevated FLC ratio of 1.94 with both kappa and lambda light chains elevated. - I discussed labs from 10/29/2023: FLC ratio is 1.67, kappa light chain 65.6 and lambda light chains 39.3.  They are commensurate  with CKD. - will repeat FLC in 6 months.    No orders of the defined types were placed in this encounter.     Sharon Spencer,acting as a Neurosurgeon for Sharon Stands, MD.,have documented all relevant documentation on the behalf of Sharon Stands, MD,as directed by  Sharon Stands, MD while in the presence of Sharon Stands, MD.  I, Sharon Stands MD, have reviewed the above documentation for accuracy and completeness, and I agree with the above.    Bunkie R Spencer   6/23/20259:25 AM  CHIEF COMPLAINT:   Diagnosis: leukocytosis and IDA   Cancer Staging  No matching staging information was found for the patient.    Prior Therapy: None  Current Therapy: Monoferric    HISTORY OF PRESENT ILLNESS:   Oncology History   No history exists.     INTERVAL HISTORY:   Sharon Spencer is a 73 y.o. female presenting to clinic today for follow up of leukocytosis and IDA. She was last seen by me on 11/12/23.  Today, she states that she is doing well overall. Her appetite level is at 50%. Her energy level is at 20%.  PAST MEDICAL HISTORY:   Past Medical History: Past Medical History:  Diagnosis Date   Anxiety    Arthritis    rheumatoid   Carpal tunnel syndrome    Depression    Diabetes mellitus (HCC)    Elevated liver enzymes 02/27/2013   AUG 2014 GGT 470 ALT 41 AST 32 ALK PHOS 275    Fibromyalgia    HTN (hypertension)  Lumbago    Migraine    Myalgia and myositis, unspecified    Osteoporosis    PONV (postoperative nausea and vomiting)    Recurrent UTI    specialist at [facility]   Rheumatoid arthritis(714.0)    sepcialist at [facility], Dr. Mallie Glatter   Sinusitis    TIA (transient ischemic attack) 1991, 2006    Surgical History: Past Surgical History:  Procedure Laterality Date   BACK SURGERY     lower   BALLOON DILATION N/A 12/07/2020   Procedure: BALLOON DILATION;  Surgeon: Sharon Carlin POUR, Spencer;  Location: [facility];  Service: Endoscopy;   Laterality: N/A;   BIOPSY  12/07/2020   Procedure: BIOPSY;  Surgeon: Sharon Carlin POUR, Spencer;  Location: [facility];  Service: Endoscopy;;   COLONOSCOPY  2003   Dr. Rehman--> ischemic colitis   COLONOSCOPY WITH PROPOFOL   06/04/2012   SLF:A sessile polyp measuring 3 mm, tubular adenoma, recommended repeat in 10 years.    ESOPHAGOGASTRODUODENOSCOPY (EGD) WITH ESOPHAGEAL DILATION  06/17/2012   DOQ:Dryjusxp ring was found at the gastroesophageal junction/Non-erosive gastritis (inflammation)   ESOPHAGOGASTRODUODENOSCOPY (EGD) WITH PROPOFOL   06/04/2012   DOQ:Dryjusxp ring was found at the gastroesophageal junction/SIGMOID ESOPHAGUS(DISTAL)/Non-erosive gastritis (inflammation)   ESOPHAGOGASTRODUODENOSCOPY (EGD) WITH PROPOFOL  N/A 10/01/2018   Dr. Harvey: LA Grade B esophagitis, hh, peptic stricture s/p dilation, gastritis (due to Mobic)   ESOPHAGOGASTRODUODENOSCOPY (EGD) WITH PROPOFOL  N/A 12/07/2020   Surgeon: Sharon Carlin POUR, Spencer;  Mild Schatzki's ring in distal esophagus s/p dilation and obliteration with biopsy forceps, gastritis biopsied, normal examined duodenum.  GE junction biopsy-mild inflammation consistent with reflux.  Gastric biopsy-slight chronic inflammation, negative for H. pylori.   NASAL SINUS SURGERY     x3   PARTIAL HYSTERECTOMY     POLYPECTOMY  06/04/2012   Procedure: POLYPECTOMY;  Surgeon: Margo LITTIE Harvey, MD;  Location: [facility];  Service: Endoscopy;  Laterality: N/A;   ROTATOR CUFF REPAIR     SAVORY DILATION  06/04/2012   Procedure: SAVORY DILATION;  Surgeon: Margo LITTIE Harvey, MD;  Location: [facility];  Service: Endoscopy;  Laterality: N/A;  12mm, 12.8 mm, 14 mm   SAVORY DILATION N/A 10/01/2018   Procedure: SAVORY DILATION;  Surgeon: Harvey Margo LITTIE, MD;  Location: [facility];  Service: Endoscopy;  Laterality: N/A;   SHOULDER SURGERY     left   TONSILLECTOMY     TUBAL LIGATION      Social History: Social History   Socioeconomic History   Marital status: Married     Spouse name: Not on file   Number of children: 1   Years of education: Not on file   Highest education level: Not on file  Occupational History   Occupation: disability    Employer: UNEMPLOYED  Tobacco Use   Smoking status: Never   Smokeless tobacco: Never  Vaping Use   Vaping status: Never Used  Substance and Sexual Activity   Alcohol use: No   Drug use: No   Sexual activity: Not Currently  Other Topics Concern   Not on file  Social History Narrative   Not on file   Social Drivers of Health   Financial Resource Strain: Low Risk  (10/06/2023)   Received from [facility]   Overall Financial Resource Strain (CARDIA)    Difficulty of Paying Living Expenses: Not hard at all  Food Insecurity: No Food Insecurity (10/06/2023)   Received from [facility]   Hunger Vital Sign    Within  the past 12 months, you worried that your food would run out before you got the money to buy more.: Never true    Within the past 12 months, the food you bought just didn't last and you didn't have money to get more.: Never true  Transportation Needs: No Transportation Needs (10/06/2023)   Received from [facility] - Transportation    In the past 12 months, has lack of transportation kept you from medical appointments or from getting medications?: No    Lack of Transportation (Non-Medical): No  Physical Activity: Insufficiently Active (10/04/2020)   Received from [facility]   Exercise Vital Sign    On average, how many days per week Spencer you engage in moderate to strenuous exercise (like a brisk walk)?: 2 days    On average, how many minutes Spencer you engage in exercise at this level?: 30 min  Stress: Stress Concern Present (10/04/2020)   Received from [facility] - Occupational Stress Questionnaire    Feeling of Stress : Very much  Social Connections: Unknown  (10/04/2020)   Received from [facility]   Social Connection and Isolation Panel    In a typical week, how many times Spencer you talk on the phone with family, friends, or neighbors?: Once a week    How often Spencer you get together with friends or relatives?: Never    How often Spencer you attend church or religious services?: Patient declined    Spencer you belong to any clubs or organizations such as church groups, unions, fraternal or athletic groups, or school groups?: No    How often Spencer you attend meetings of the clubs or organizations you belong to?: Never    Are you married, widowed, divorced, separated, never married, or living with a partner?: Married  Intimate Partner Violence: Not At Risk (06/19/2023)   Humiliation, Afraid, Rape, and Kick questionnaire    Fear of Current or Ex-Partner: No    Emotionally Abused: No    Physically Abused: No    Sexually Abused: No    Family History: Family History  Problem Relation Age of Onset   Diabetes Father    Kidney failure Father    Breast cancer Mother    Stroke Mother    Diabetes Other        siblings   Lymphoma Sister    Liver disease Other        history of elevated LFTs in sister too   Colon cancer Neg Hx     Current Medications:  Current Outpatient Medications:    acetaminophen  (TYLENOL ) 500 MG tablet, Take 1,000 mg by mouth every 8 (eight) hours as needed for moderate pain., Disp: , Rfl:    ALPRAZolam (XANAX) 0.5 MG tablet, Take 0.5 mg by mouth at bedtime as needed for anxiety., Disp: , Rfl:    amLODipine (NORVASC) 10 MG tablet, Take 10 mg by mouth daily., Disp: , Rfl:    aspirin 81 MG chewable tablet, Chew by mouth., Disp: , Rfl:    atenolol (TENORMIN) 100 MG tablet, Take 100 mg by mouth daily. , Disp: , Rfl:    atorvastatin (LIPITOR) 40 MG tablet, Take by mouth., Disp: , Rfl:    cyanocobalamin  (VITAMIN B12) 1000 MCG tablet, Take by mouth., Disp: , Rfl:    CYMBALTA 60 MG capsule, Take 60 mg by mouth 2 (two) times  daily., Disp: , Rfl:  diclofenac Sodium (VOLTAREN) 1 % GEL, Apply 1 application topically 3 (three) times daily as needed (pain)., Disp: , Rfl:    escitalopram (LEXAPRO) 10 MG tablet, Take 10 mg by mouth daily., Disp: , Rfl:    folic acid  (FOLVITE ) 1 MG tablet, Take 1 mg by mouth daily., Disp: , Rfl:    gabapentin (NEURONTIN) 400 MG capsule, Take 2 capsules (800 mg total) by mouth at bedtime., Disp: , Rfl:    guaiFENesin (MUCINEX) 600 MG 12 hr tablet, Take 600 mg by mouth 2 (two) times daily., Disp: , Rfl:    hydroxychloroquine (PLAQUENIL) 200 MG tablet, Take 200 mg by mouth 2 (two) times daily., Disp: , Rfl:    ipratropium (ATROVENT) 0.02 % nebulizer solution, Inhale 0.5 mg into the lungs., Disp: , Rfl:    linaclotide  (LINZESS ) 145 MCG CAPS capsule, Take 1 capsule (145 mcg total) by mouth daily before breakfast., Disp: 30 capsule, Rfl: 3   methotrexate (RHEUMATREX) 10 MG tablet, Take by mouth., Disp: , Rfl:    montelukast (SINGULAIR) 10 MG tablet, Take 10 mg by mouth at bedtime., Disp: , Rfl:    Multiple Vitamins-Minerals (MULTIVITAMIN WITH MINERALS) tablet, Take 1 tablet by mouth daily., Disp: , Rfl:    omeprazole  (PRILOSEC) 40 MG capsule, TAKE 1 CAPSULE (40 MG TOTAL) BY MOUTH 2 (TWO) TIMES DAILY BEFORE A MEAL., Disp: 180 capsule, Rfl: 1   ondansetron  (ZOFRAN -ODT) 4 MG disintegrating tablet, Take 4 mg by mouth every 8 (eight) hours as needed., Disp: , Rfl:    oxyCODONE (ROXICODONE) 15 MG immediate release tablet, Take 15 mg by mouth 3 (three) times daily as needed for pain., Disp: , Rfl:    predniSONE (DELTASONE) 10 MG tablet, Take 10 mg by mouth daily., Disp: , Rfl:    sulfaSALAzine (AZULFIDINE) 500 MG tablet, Take 500 mg by mouth 2 (two) times daily., Disp: , Rfl:    tiZANidine (ZANAFLEX) 2 MG tablet, Take 2 mg by mouth at bedtime., Disp: , Rfl:    traZODone (DESYREL) 50 MG tablet, Take 50-100 mg by mouth at bedtime., Disp: , Rfl:  No current facility-administered medications for this  visit.  Facility-Administered Medications Ordered in Other Visits:    mineral oil light 100 % (sterile), , , PRN, Harvey Margo CROME, MD, 1 application  at 06/04/12 1345   Allergies: Allergies  Allergen Reactions   Carafate [Sucralfate]     SEVERE GAS AND BLOATING   Compazine [Prochlorperazine Edisylate] Other (See Comments)    pretzel effect   Erythromycin Nausea And Vomiting   Sulfa Antibiotics Nausea And Vomiting    REVIEW OF SYSTEMS:   Review of Systems  Constitutional:  Negative for chills, fatigue and fever.  HENT:   Negative for lump/mass, mouth sores, nosebleeds, sore throat and trouble swallowing.   Eyes:  Negative for eye problems.  Respiratory:  Negative for cough and shortness of breath.   Cardiovascular:  Negative for chest pain, leg swelling and palpitations.  Gastrointestinal:  Positive for constipation and nausea. Negative for abdominal pain, diarrhea and vomiting.  Genitourinary:  Negative for bladder incontinence, difficulty urinating, dysuria, frequency, hematuria and nocturia.   Musculoskeletal:  Negative for arthralgias, back pain, flank pain, myalgias and neck pain.  Skin:  Negative for itching and rash.  Neurological:  Positive for dizziness and headaches. Negative for numbness.  Hematological:  Does not bruise/bleed easily.  Psychiatric/Behavioral:  Positive for depression and sleep disturbance. Negative for suicidal ideas. The patient is not nervous/anxious.   All other systems  reviewed and are negative.    VITALS:   There were no vitals taken for this visit.  Wt Readings from Last 3 Encounters:  11/12/23 130 lb (59 kg)  07/23/23 133 lb 6.1 oz (60.5 kg)  06/19/23 129 lb 9.6 oz (58.8 kg)    There is no height or weight on file to calculate BMI.  Performance status (ECOG): 1 - Symptomatic but completely ambulatory  PHYSICAL EXAM:   Physical Exam Vitals and nursing note reviewed. Exam conducted with a chaperone present.  Constitutional:       Appearance: Normal appearance.   Cardiovascular:     Rate and Rhythm: Normal rate and regular rhythm.     Pulses: Normal pulses.     Heart sounds: Normal heart sounds.  Pulmonary:     Effort: Pulmonary effort is normal.     Breath sounds: Normal breath sounds.  Abdominal:     Palpations: Abdomen is soft. There is no hepatomegaly, splenomegaly or mass.     Tenderness: There is no abdominal tenderness.   Musculoskeletal:     Right lower leg: No edema.     Left lower leg: No edema.  Lymphadenopathy:     Cervical: No cervical adenopathy.     Right cervical: No superficial, deep or posterior cervical adenopathy.    Left cervical: No superficial, deep or posterior cervical adenopathy.     Upper Body:     Right upper body: No supraclavicular or axillary adenopathy.     Left upper body: No supraclavicular or axillary adenopathy.   Neurological:     General: No focal deficit present.     Mental Status: She is alert and oriented to person, place, and time.   Psychiatric:        Mood and Affect: Mood normal.        Behavior: Behavior normal.     LABS:   CBC     Component Value Date/Time   WBC 8.6 10/29/2023 1116   RBC 3.89 10/29/2023 1116   HGB 8.7 (L) 10/29/2023 1116   HCT 30.0 (L) 10/29/2023 1116   PLT 408 (H) 10/29/2023 1116   MCV 77.1 (L) 10/29/2023 1116   MCH 22.4 (L) 10/29/2023 1116   MCHC 29.0 (L) 10/29/2023 1116   RDW 19.9 (H) 10/29/2023 1116   LYMPHSABS 1.6 10/29/2023 1116   MONOABS 0.7 10/29/2023 1116   EOSABS 0.2 10/29/2023 1116   BASOSABS 0.0 10/29/2023 1116    CMP      Component Value Date/Time   NA 140 10/21/2020 1052   NA 136 (A) 03/28/2012 0933   K 3.1 (L) 10/21/2020 1052   CL 101 10/21/2020 1052   CO2 31 10/21/2020 1052   GLUCOSE 103 10/21/2020 1052   BUN 14 10/21/2020 1052   BUN 18 03/28/2012 0933   CREATININE 1.18 (H) 10/21/2020 1052   CALCIUM 9.4 10/21/2020 1052   CALCIUM 9.9 03/28/2012 0933   PROT 7.0 10/21/2020 1052   ALBUMIN 4.0  10/01/2014 1252   AST 39 (H) 10/21/2020 1052   ALT 30 (H) 10/21/2020 1052   ALKPHOS 98 10/01/2014 1252   BILITOT 0.3 10/21/2020 1052   GFRNONAA 46 (L) 10/21/2020 1052   GFRAA 54 (L) 10/21/2020 1052     No results found for: CEA1, CEA / No results found for: CEA1, CEA No results found for: PSA1 No results found for: CAN199 No results found for: CAN125  No results found for: TOTALPROTELP, ALBUMINELP, A1GS, A2GS, BETS, BETA2SER, GAMS, MSPIKE, SPEI Lab Results  Component Value Date   TIBC 380 10/29/2023   TIBC 431 07/23/2023   FERRITIN 11 10/29/2023   FERRITIN 23 07/23/2023   IRONPCTSAT 6 (L) 10/29/2023   IRONPCTSAT 4 (L) 07/23/2023   Lab Results  Component Value Date   LDH 164 10/29/2023   LDH 167 06/19/2023     STUDIES:   No results found.

## 2024-03-10 NOTE — Progress Notes (Signed)
 Chief Complaint:   Chief Complaint  Patient presents with  . Chronic Pain    Subjective  Sharon Spencer is a 73 y.o. female in today for: HPI: History of Present Illness Sharon Spencer is a 73 year old female with rheumatoid arthritis who presents with a rash and joint pain.  She has been experiencing a rash and joint pain for about a month. The rash is described as 'lobster red' and resembles a sunburn, primarily affecting her knuckles and extending to her wrists and fingers. It is painful, especially when exposed to sunlight, and she experiences occasional flare-ups. Initially, she thought it was an allergic reaction to a new lotion, but the symptoms persisted even after discontinuing the lotion. She wonders if the symptoms are related to her rheumatoid arthritis.  She experienced a fall last night, hitting the back of her head on a door, resulting in soreness and a headache. There was no loss of consciousness during the fall.   She mentions difficulty with urination, often feeling the need to urinate but being unable to do so until she leaves and returns to the bathroom. This issue has been ongoing since last year, around the time of her stroke.     Patient Active Problem List  Diagnosis  . Fibromyalgia  . 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 Gastroesophageal reflux disease without esophagitis  . Hiatal hernia  . Irritable bowel syndrome with constipation  . Age-related osteoporosis without current pathological fracture  . Essential hypertension  . Stage 3a chronic kidney disease (CMS/HHS-HCC)  . Compression fracture of thoracic vertebra, closed, initial encounter (CMS/HHS-HCC)  . Rheumatoid arthritis involving multiple sites with positive rheumatoid factor (CMS/HHS-HCC)  . Diastolic dysfunction  . Aortic valve sclerosis  . Recurrent major depressive disorder, in partial remission ()  . Mild protein-calorie malnutrition (CMS/HHS-HCC)  .  Hyperlipidemia  . Cerebrovascular accident (CVA) (CMS/HHS-HCC)  . Cerebral aneurysm (HHS-HCC)    Outpatient Medications Prior to Visit  Medication Sig Dispense Refill  . acetaminophen  (TYLENOL ) 500 MG tablet Take 1,000 mg by mouth every 8 (eight) hours as needed for Pain    . amLODIPine (NORVASC) 10 MG tablet Take 1 tablet (10 mg total) by mouth once daily 90 tablet 3  . aspirin 81 MG chewable tablet Take 1 tablet (81 mg total) by mouth once daily 30 tablet 0  . atenoloL (TENORMIN) 100 MG tablet Take 1 tablet (100 mg total) by mouth once daily 90 tablet 3  . atorvastatin (LIPITOR) 40 MG tablet Take 1 tablet (40 mg total) by mouth once daily For cholesterol 90 tablet 3  . clobetasol (TEMOVATE) 0.05 % cream Apply topically once daily as needed Apply as needed      . cyanocobalamin  (VITAMIN B12) 1000 MCG tablet TAKE 1 TABLET BY MOUTH ONCE DAILY 100 tablet 1  . escitalopram oxalate (LEXAPRO) 10 MG tablet TAKE 1 TABLET (10 MG) BY MOUTH EVERY DAY 30 tablet 2  . folic acid  (FOLVITE ) 1 MG tablet Take 1 tablet (1,000 mcg total) by mouth once daily 100 tablet 3  . gabapentin (NEURONTIN) 300 MG capsule Take 2 capsules (600 mg total) by mouth 3 (three) times daily for 90 days 540 capsule 0  . hydroxychloroquine (PLAQUENIL) 200 mg tablet Take 1 tablet (200 mg total) by mouth 2 (two) times daily 180 tablet 2  . ipratropium (ATROVENT) 0.02 % nebulizer solution Take 2.5 mLs (0.5 mg total) by nebulization 4 (four) times daily 300 mL 11  . linaCLOtide  (  LINZESS ) 145 mcg capsule Take 1 capsule (145 mcg total) by mouth every morning before breakfast 30 capsule 0  . montelukast (SINGULAIR) 10 mg tablet Take 1 tablet (10 mg total) by mouth at bedtime 100 tablet 3  . naloxone (NARCAN) 4 mg/actuation nasal spray 1 spray (4 mg total) by Nasal route once as needed (if not breathing or overdose is suspected.) for up to 1 dose Give 2nd dose in 5-10 min if not responding or if sx return for up to 1 dose. 2 each 1  .  omeprazole  (PRILOSEC) 40 MG DR capsule Take 40 mg by mouth once daily    . oxyCODONE (OXYIR) 20 mg immediate release tablet Take 1 tablet (20 mg total) by mouth every 6 (six) hours as needed for Pain for up to 30 days 120 tablet 0  . [START ON 03/19/2024] oxyCODONE (OXYIR) 20 mg immediate release tablet Take 1 tablet (20 mg total) by mouth every 6 (six) hours as needed for Pain for up to 30 days 120 tablet 0  . predniSONE (DELTASONE) 5 MG tablet Take 1 tablet (5 mg total) by mouth once daily 90 tablet 3  . rimegepant (NURTEC ODT) 75 mg disintegrating tablet Take 1 tablet (75 mg total) by mouth as directed (for headache) for up to 180 days Take one tablet for headache. Can repeat doses every other day for headache disorder 15 tablet 5  . tiZANidine (ZANAFLEX) 2 MG tablet Take 0.5-1 tablets (1-2 mg total) by mouth 3 (three) times daily as needed for Muscle spasms 30 tablet 0  . traZODone (DESYREL) 50 MG tablet Take 1 tablet (50 mg total) by mouth at bedtime as needed for Sleep 30 tablet 0  . [START ON 04/18/2024] oxyCODONE (OXYIR) 20 mg immediate release tablet Take 1 tablet (20 mg total) by mouth every 6 (six) hours as needed for Pain for up to 30 days 120 tablet 0   No facility-administered medications prior to visit.     Objective  Vitals:   03/10/24 1108 03/10/24 1116  BP: (!) 181/79 (!) 176/87  Pulse: 77 78  Weight:  58.1 kg (128 lb 1.4 oz)  PainSc:   8   PainLoc: Leg    Body mass index is 25.15 kg/m. Home Vitals:     Physical Exam  Physical Exam Constitutional: alert, in NAD, and communicates well Respiratory: clear to auscultation, without rales or wheezes  Cardiovascular: regular rate and rhythm and without murmurs, rubs or gallops Skin ankles/feet: no rashes on hands Neurological: sensorimotor grossly intact, normal muscle tone, and uses cane    Results   No results found for this visit on 03/10/24.     Assessment/Plan:   Assessment & Plan Essential  hypertension Blood pressure elevated at 176/87 mmHg, improved but requires management. Prioritized blood pressure control to alleviate headaches. Discussed mail order for medications to reduce cost. -Add valsartan Referral for Nurse BP visit:  Hypertension Step-wise Therapy  Goal BP:<140/90  Barriers to control:None  Planned medication if BP above goal (nurses - please pend order): Other: valsartan 80 mg  Are labs required at time of nurse visit?  No  How soon to follow up with provider if BP below goal (controlled): 3 months  How soon to follow up with provider if BP above goal (uncontrolled): 4 weeks nurse BP check, if not at goal at that visit, increase to valsartan 160 mg and schedule 1 month PCP visit   Headache  Persistent headaches may relate to elevated blood pressure.  Previous Nurtec prescription not filled due to cost. - Focus on controlling blood pressure to assess impact on headaches. - Consider alternative medication in the same family as Nurtec if headaches persist after blood pressure control (Imitrex)  Rheumatoid arthritis involving multiple sites with positive rheumatoid factor Intermittent flare-ups with symptoms resembling allergic reaction. Symptoms improved but present. Upcoming rheumatology appointment on September 8th. - Continue current medications including Prednisone and Plaquenil. - Follow up with rheumatologist on September 8th.  Urinary hesitancy Difficulty initiating urination, possibly related to spinal issues. Previous CT scan not completed. Discussed non-invasive management strategies. - Initiate bladder training exercises, including Kegel exercises. - Encourage use of a squatty potty - Increase water  intake to improve hydration and alleviate symptoms.  There are no diagnoses linked to this encounter.  This visit was coded based on medical decision making (MDM).           Future Appointments     Date/Time Provider Department Center Visit  Type   03/28/2024 11:30 AM (Arrive by 11:00 AM) Bernardino Ester Ned, MD [facility]URN [facility]       There are no Patient Instructions on file for this visit.  An after visit summary was provided for the patient either in written format (printed) or through MyChart.  This note has been created using automated tools and reviewed for accuracy by ALEXA KATHLEEN NAMBA.   Alexa Namba, DO, MPH [facility]

## 2024-03-25 ENCOUNTER — Ambulatory Visit (HOSPITAL_COMMUNITY)
Admission: RE | Admit: 2024-03-25 | Discharge: 2024-03-25 | Disposition: A | Discharge status: Home / Self Care | Source: Ambulatory Visit | Attending: Adult Health Nurse Practitioner | Admitting: Adult Health Nurse Practitioner

## 2024-03-25 DIAGNOSIS — I671 Cerebral aneurysm, nonruptured: Secondary | ICD-10-CM | POA: Diagnosis present

## 2024-03-25 LAB — POCT I-STAT CREATININE: Creatinine, Ser: 1 mg/dL (ref 0.44–1.00)

## 2024-03-25 MED ORDER — IOHEXOL 350 MG/ML SOLN
75.0000 mL | Freq: Once | INTRAVENOUS | Status: AC | PRN
  Administered 2024-03-25: 75 mL via INTRAVENOUS

## 2024-05-12 ENCOUNTER — Encounter: Payer: Self-pay | Admitting: *Deleted

## 2024-05-12 ENCOUNTER — Other Ambulatory Visit: Payer: Self-pay | Admitting: *Deleted

## 2024-05-12 DIAGNOSIS — D72825 Bandemia: Secondary | ICD-10-CM

## 2024-05-12 DIAGNOSIS — D508 Other iron deficiency anemias: Secondary | ICD-10-CM

## 2024-05-12 NOTE — Addendum Note (Signed)
 Addended by: Teriann Livingood A on: 05/12/2024 03:44 PM   Modules accepted: Orders

## 2024-06-23 ENCOUNTER — Inpatient Hospital Stay: Admitting: Physician Assistant

## 2024-07-14 ENCOUNTER — Encounter: Payer: Self-pay | Admitting: *Deleted
